# Patient Record
Sex: Female | Born: 1970 | Race: White | Hispanic: No | Marital: Married | Smoking: Never smoker
Health system: Southern US, Community
[De-identification: ages and names within clinical notes are randomized; demographics above are authoritative.]

## PROBLEM LIST (undated history)

## (undated) DIAGNOSIS — E039 Hypothyroidism, unspecified: Secondary | ICD-10-CM

## (undated) DIAGNOSIS — R112 Nausea with vomiting, unspecified: Secondary | ICD-10-CM

## (undated) DIAGNOSIS — Z87441 Personal history of nephrotic syndrome: Secondary | ICD-10-CM

## (undated) DIAGNOSIS — G2581 Restless legs syndrome: Secondary | ICD-10-CM

## (undated) DIAGNOSIS — A6920 Lyme disease, unspecified: Secondary | ICD-10-CM

## (undated) DIAGNOSIS — Z9889 Other specified postprocedural states: Secondary | ICD-10-CM

## (undated) DIAGNOSIS — N189 Chronic kidney disease, unspecified: Secondary | ICD-10-CM

## (undated) DIAGNOSIS — E349 Endocrine disorder, unspecified: Secondary | ICD-10-CM

## (undated) DIAGNOSIS — E079 Disorder of thyroid, unspecified: Secondary | ICD-10-CM

## (undated) HISTORY — PX: CARPAL TUNNEL RELEASE: SHX101

## (undated) HISTORY — PX: OTHER SURGICAL HISTORY: SHX169

---

## 2000-12-02 ENCOUNTER — Ambulatory Visit (HOSPITAL_COMMUNITY): Admission: RE | Admit: 2000-12-02 | Discharge: 2000-12-02 | Payer: Self-pay | Admitting: Gastroenterology

## 2002-10-16 ENCOUNTER — Other Ambulatory Visit: Admission: RE | Admit: 2002-10-16 | Discharge: 2002-10-16 | Payer: Self-pay | Admitting: *Deleted

## 2003-05-25 ENCOUNTER — Emergency Department (HOSPITAL_COMMUNITY): Admission: EM | Admit: 2003-05-25 | Discharge: 2003-05-25 | Payer: Self-pay | Admitting: Emergency Medicine

## 2003-05-26 ENCOUNTER — Other Ambulatory Visit (HOSPITAL_COMMUNITY): Admission: RE | Admit: 2003-05-26 | Discharge: 2003-06-02 | Payer: Self-pay | Admitting: *Deleted

## 2003-11-19 ENCOUNTER — Other Ambulatory Visit: Admission: RE | Admit: 2003-11-19 | Discharge: 2003-11-19 | Payer: Self-pay | Admitting: Gynecology

## 2004-08-22 ENCOUNTER — Inpatient Hospital Stay (HOSPITAL_COMMUNITY): Admission: AD | Admit: 2004-08-22 | Discharge: 2004-08-25 | Payer: Self-pay | Admitting: *Deleted

## 2014-05-23 ENCOUNTER — Encounter (HOSPITAL_COMMUNITY): Payer: Self-pay | Admitting: Emergency Medicine

## 2014-05-23 ENCOUNTER — Emergency Department (HOSPITAL_COMMUNITY)
Admission: EM | Admit: 2014-05-23 | Discharge: 2014-05-24 | Disposition: A | Discharge status: Home / Self Care | Payer: BC Managed Care – PPO | Attending: Emergency Medicine | Admitting: Emergency Medicine

## 2014-05-23 DIAGNOSIS — L02519 Cutaneous abscess of unspecified hand: Secondary | ICD-10-CM | POA: Insufficient documentation

## 2014-05-23 DIAGNOSIS — L03119 Cellulitis of unspecified part of limb: Principal | ICD-10-CM

## 2014-05-23 DIAGNOSIS — Z8639 Personal history of other endocrine, nutritional and metabolic disease: Secondary | ICD-10-CM | POA: Insufficient documentation

## 2014-05-23 DIAGNOSIS — Z862 Personal history of diseases of the blood and blood-forming organs and certain disorders involving the immune mechanism: Secondary | ICD-10-CM | POA: Insufficient documentation

## 2014-05-23 DIAGNOSIS — Z8669 Personal history of other diseases of the nervous system and sense organs: Secondary | ICD-10-CM | POA: Insufficient documentation

## 2014-05-23 DIAGNOSIS — Z8619 Personal history of other infectious and parasitic diseases: Secondary | ICD-10-CM | POA: Insufficient documentation

## 2014-05-23 HISTORY — DX: Restless legs syndrome: G25.81

## 2014-05-23 HISTORY — DX: Endocrine disorder, unspecified: E34.9

## 2014-05-23 HISTORY — DX: Disorder of thyroid, unspecified: E07.9

## 2014-05-23 HISTORY — DX: Lyme disease, unspecified: A69.20

## 2014-05-23 MED ORDER — CLINDAMYCIN HCL 300 MG PO CAPS: 300.0000 mg | ORAL_CAPSULE | Freq: Four times a day (QID) | ORAL | Status: DC

## 2014-05-23 MED ORDER — CLINDAMYCIN HCL 300 MG PO CAPS
300.0000 mg | ORAL_CAPSULE | Freq: Once | ORAL | Status: AC
  Administered 2014-05-23: 300 mg via ORAL
  Filled 2014-05-23: qty 1

## 2014-05-23 NOTE — Discharge Instructions (Signed)
Keep your wound infection clean and dry. Use warm compresses for 20-30 minutes to help your body fight the infection. Take the antibiotics as prescribed and followup with an orthopedic hand specialist. Return to the emergency room for any changing or worsening symptoms.    Abscess Care After An abscess (also called a boil or furuncle) is an infected area that contains a collection of pus. Signs and symptoms of an abscess include pain, tenderness, redness, or hardness, or you may feel a moveable soft area under your skin. An abscess can occur anywhere in the body. The infection may spread to surrounding tissues causing cellulitis. A cut (incision) by the surgeon was made over your abscess and the pus was drained out. Gauze may have been packed into the space to provide a drain that will allow the cavity to heal from the inside outwards. The boil may be painful for 5 to 7 days. Most people with a boil do not have high fevers. Your abscess, if seen early, may not have localized, and may not have been lanced. If not, another appointment may be required for this if it does not get better on its own or with medications. HOME CARE INSTRUCTIONS   Only take over-the-counter or prescription medicines for pain, discomfort, or fever as directed by your caregiver.  When you bathe, soak and then remove gauze or iodoform packs at least daily or as directed by your caregiver. You may then wash the wound gently with mild soapy water. Repack with gauze or do as your caregiver directs. SEEK IMMEDIATE MEDICAL CARE IF:   You develop increased pain, swelling, redness, drainage, or bleeding in the wound site.  You develop signs of generalized infection including muscle aches, chills, fever, or a general ill feeling.  An oral temperature above 102 F (38.9 C) develops, not controlled by medication. See your caregiver for a recheck if you develop any of the symptoms described above. If medications (antibiotics) were  prescribed, take them as directed. Document Released: 07/05/2005 Document Revised: 03/10/2012 Document Reviewed: 03/01/2008 [facility] Patient Information 2014 [place], [place].

## 2014-05-23 NOTE — ED Notes (Signed)
Pt A+ox4, reports onset "spot" between L 4th and 5th fingers onset x2 days ago, worsening.  Red, swollen area noted between fingers, warm.  Pt reports currently receiving treatment for lyme dx "and i think it was suppressing my immune system, my doctor told me to take a break and that's when this started".  Pt denies fevers/chills.  Skin otherwise PWD.  Pt denies other complaints.  Ambulatory with steady gait.  NAD.

## 2014-05-23 NOTE — ED Provider Notes (Signed)
CSN: [id number]     Arrival date & time 05/23/14  2156 History  This chart was scribed for non-physician practitioner, Ivonne AndrewPeter Betsy Rosello, PA-C,working with Junius ArgyleForrest S Harrison, MD, by Karle PlumberJennifer Tensley, ED Scribe.  This patient was seen in room WTR5/WTR5 and the patient's care was started at 10:22 PM.  Chief Complaint  Patient presents with  . Hand Pain   The history is provided by the patient. No language interpreter was used.   HPI Comments:  Alice Robertson is a 44 y.o. female who presents to the Emergency Department complaining of a skin infection between the fourth and fifth fingers of the left hand that started several weeks ago. She states the area began as a "tiny little bump" and has been worsening with it getting the worst in the last 24-48 hours. Pt states she has been taking herbal medications for treatment of Lyme Disease. She states her doctor told her to discontinue this treatment fearing it was suppressing her immune system. She states upon discontinuing the treatment, this area started worsening. 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 She denies any similar issue or symptoms to this area before. She denies fever, chills, nausea, or vomiting.     Past Medical History  Diagnosis Date  . Lyme disease   . Thyroid disease   . Restless leg syndrome   . Hormone imbalance    Past Surgical History  Procedure Laterality Date  . Carpal tunnel release     No family history on file. History  Substance Use Topics  . Smoking status: Never Smoker   . Smokeless tobacco: Never Used  . Alcohol Use: No   OB History   Grav Para Term Preterm Abortions TAB SAB Ect Mult Living                 Review of Systems  Constitutional: Negative for fever and chills.  Gastrointestinal: Negative for nausea and vomiting.  Skin:       Skin infection between fourth and fifth digits of left hand.    Allergies  Compazine  Home Medications   Prior to Admission  medications   Not on File   Triage Vitals: BP 126/78  Pulse 76  Temp(Src) 98 F (36.7 C) (Oral)  Resp 16  Ht 5\' 2"  (1.575 m)  Wt 115 lb (52.164 kg)  BMI 21.03 kg/m2  SpO2 100%  LMP 05/14/2014 Physical Exam  Nursing note and vitals reviewed. Constitutional: She is oriented to person, place, and time. She appears well-developed and well-nourished.  HENT:  Head: Normocephalic and atraumatic.  Eyes: EOM are normal.  Neck: Normal range of motion.  Cardiovascular: Normal rate.   Pulmonary/Chest: Effort normal.  Musculoskeletal: Normal range of motion.  Full ROM of fourth and fifth digits of left hand.  Neurological: She is alert and oriented to person, place, and time.  Skin: Skin is warm and dry.  1 cm area of erythema and swelling to the web spacing of the left fourth and fifth digit. Slight pointing and small pustule. Area is tender.  Psychiatric: She has a normal mood and affect. Her behavior is normal.    ED Course  Procedures  DIAGNOSTIC STUDIES: Oxygen Saturation is 100% on RA, normal by my interpretation.   COORDINATION OF CARE: 10:44 PM- Will use a needle to determine if area needs I & D. Will prescribe antibiotics. Pt verbalizes understanding and agrees to plan.  Area of pain was initially probed with a  small 25-gauge needle. This did express a small amount of purulent discharge. Further I&D was performed with a very thick-appearing discharge initially. There is still some firmness and nodularity that was not expressed. This may be some associated cellulitis versus a possible infected cysts or bursa. At this time will place patient on clindamycin and encourage warm compresses with orthopedic hand referral. Strict return precautions given.     INCISION AND DRAINAGE PROCEDURE NOTE: Patient identification was confirmed and verbal consent was obtained. This procedure was performed by Ivonne Andrew, PA-C at 11:00 PM. Site: web spacing between 4th and 5th digit of left  hand Sterile procedures observed Needle size: 25 G Anesthetic used (type and amt): Lidocaine 1% without Epinephrine (2 mL) Blade size: 11 G Drainage: small Complexity: Simple Site anesthetized, incision made over site, wound drained and explored loculations, rinsed with copious amounts of normal saline, wound packed with sterile gauze, covered with dry, sterile dressing.  Pt tolerated procedure well without complications.  Instructions for care discussed verbally and pt provided with additional written instructions for homecare and f/u.  Medications  clindamycin (CLEOCIN) capsule 300 mg (not administered)     MDM   Final diagnoses:  Abscess of hand      I personally performed the services described in this documentation, which was scribed in my presence. The recorded information has been reviewed and is accurate.    Angus Seller, PA-C 05/24/14 [phone]

## 2014-05-24 NOTE — ED Provider Notes (Signed)
Medical screening examination/treatment/procedure(s) were conducted as a shared visit with non-physician practitioner(s) and myself.  I personally evaluated the patient during the encounter.   EKG Interpretation None        Junius Argyle, MD 05/24/14 1210

## 2015-09-02 ENCOUNTER — Telehealth: Payer: Self-pay | Admitting: Family Medicine

## 2015-09-02 NOTE — Telephone Encounter (Signed)
Pt called to make a new patient appt and since she has the high deductable insurance plan, she is wanting a ball park figure of how much this appt. Can you please call her about this [phone]

## 2015-09-06 NOTE — Telephone Encounter (Signed)
Spoke to pt, explained to her that i was unable to answer that question due to the fact i'm not sure what all Dr. Katrinka Blazing would want to do during the visit. I provided her with billing's number to see if they can help.

## 2015-09-22 ENCOUNTER — Ambulatory Visit: Payer: Self-pay | Admitting: Family Medicine

## 2016-03-02 ENCOUNTER — Ambulatory Visit: Payer: Self-pay | Admitting: Family Medicine

## 2016-03-22 ENCOUNTER — Ambulatory Visit: Payer: Self-pay | Admitting: Family Medicine

## 2016-05-17 ENCOUNTER — Other Ambulatory Visit: Payer: Self-pay | Admitting: Obstetrics & Gynecology

## 2016-05-17 ENCOUNTER — Other Ambulatory Visit (HOSPITAL_COMMUNITY)
Admission: RE | Admit: 2016-05-17 | Discharge: 2016-05-17 | Disposition: A | Discharge status: Home / Self Care | Payer: 59 | Source: Ambulatory Visit | Attending: Obstetrics & Gynecology | Admitting: Obstetrics & Gynecology

## 2016-05-17 DIAGNOSIS — Z01419 Encounter for gynecological examination (general) (routine) without abnormal findings: Secondary | ICD-10-CM | POA: Diagnosis present

## 2016-05-17 DIAGNOSIS — Z1151 Encounter for screening for human papillomavirus (HPV): Secondary | ICD-10-CM | POA: Diagnosis not present

## 2016-05-21 LAB — CYTOLOGY - PAP

## 2016-12-09 ENCOUNTER — Encounter (HOSPITAL_BASED_OUTPATIENT_CLINIC_OR_DEPARTMENT_OTHER): Payer: Self-pay | Admitting: Emergency Medicine

## 2016-12-09 ENCOUNTER — Emergency Department (HOSPITAL_BASED_OUTPATIENT_CLINIC_OR_DEPARTMENT_OTHER): Payer: 59

## 2016-12-09 ENCOUNTER — Emergency Department (HOSPITAL_BASED_OUTPATIENT_CLINIC_OR_DEPARTMENT_OTHER)
Admission: EM | Admit: 2016-12-09 | Discharge: 2016-12-09 | Disposition: A | Discharge status: Home / Self Care | Payer: 59 | Attending: Emergency Medicine | Admitting: Emergency Medicine

## 2016-12-09 DIAGNOSIS — R531 Weakness: Secondary | ICD-10-CM | POA: Diagnosis not present

## 2016-12-09 DIAGNOSIS — J029 Acute pharyngitis, unspecified: Secondary | ICD-10-CM | POA: Diagnosis not present

## 2016-12-09 DIAGNOSIS — R600 Localized edema: Secondary | ICD-10-CM | POA: Diagnosis present

## 2016-12-09 DIAGNOSIS — R197 Diarrhea, unspecified: Secondary | ICD-10-CM | POA: Insufficient documentation

## 2016-12-09 DIAGNOSIS — N049 Nephrotic syndrome with unspecified morphologic changes: Secondary | ICD-10-CM

## 2016-12-09 DIAGNOSIS — R809 Proteinuria, unspecified: Secondary | ICD-10-CM

## 2016-12-09 LAB — URINALYSIS, ROUTINE W REFLEX MICROSCOPIC
Bilirubin Urine: NEGATIVE
Glucose, UA: NEGATIVE mg/dL
Ketones, ur: NEGATIVE mg/dL
Leukocytes, UA: NEGATIVE
Nitrite: NEGATIVE
Protein, ur: >300 mg/dL — AB
Specific Gravity, Urine: 1.014 (ref 1.005–1.030)
pH: 6 (ref 5.0–8.0)

## 2016-12-09 LAB — COMPREHENSIVE METABOLIC PANEL
ALT: 13 U/L — ABNORMAL LOW (ref 14–54)
AST: 27 U/L (ref 15–41)
Albumin: 1.2 g/dL — ABNORMAL LOW (ref 3.5–5.0)
Alkaline Phosphatase: 43 U/L (ref 38–126)
Anion gap: 7 (ref 5–15)
BUN: 12 mg/dL (ref 6–20)
CO2: 23 mmol/L (ref 22–32)
Calcium: 7.4 mg/dL — ABNORMAL LOW (ref 8.9–10.3)
Chloride: 98 mmol/L — ABNORMAL LOW (ref 101–111)
Creatinine, Ser: 0.83 mg/dL (ref 0.44–1.00)
GFR calc Af Amer: >60 mL/min (ref >60)
GFR calc non Af Amer: >60 mL/min (ref >60)
Glucose, Bld: 89 mg/dL (ref 65–99)
Potassium: 4 mmol/L (ref 3.5–5.1)
Sodium: 128 mmol/L — ABNORMAL LOW (ref 135–145)
Total Bilirubin: 0.3 mg/dL (ref 0.3–1.2)
Total Protein: 4.3 g/dL — ABNORMAL LOW (ref 6.5–8.1)

## 2016-12-09 LAB — CBC WITH DIFFERENTIAL/PLATELET
Basophils Absolute: 0 10*3/uL (ref 0.0–0.1)
Basophils Relative: 1 %
Eosinophils Absolute: 0.2 10*3/uL (ref 0.0–0.7)
Eosinophils Relative: 2 %
HCT: 42.2 % (ref 36.0–46.0)
Hemoglobin: 14.7 g/dL (ref 12.0–15.0)
Lymphocytes Relative: 29 %
Lymphs Abs: 2.4 10*3/uL (ref 0.7–4.0)
MCH: 28.8 pg (ref 26.0–34.0)
MCHC: 34.8 g/dL (ref 30.0–36.0)
MCV: 82.6 fL (ref 78.0–100.0)
Monocytes Absolute: 0.9 10*3/uL (ref 0.1–1.0)
Monocytes Relative: 11 %
Neutro Abs: 4.8 10*3/uL (ref 1.7–7.7)
Neutrophils Relative %: 58 %
Platelets: 523 10*3/uL — ABNORMAL HIGH (ref 150–400)
RBC: 5.11 MIL/uL (ref 3.87–5.11)
RDW: 13 % (ref 11.5–15.5)
WBC: 8.3 10*3/uL (ref 4.0–10.5)

## 2016-12-09 LAB — URINALYSIS, MICROSCOPIC (REFLEX)

## 2016-12-09 LAB — TROPONIN I: Troponin I: <0.03 ng/mL (ref <0.03)

## 2016-12-09 LAB — BRAIN NATRIURETIC PEPTIDE: B Natriuretic Peptide: 18.4 pg/mL (ref 0.0–100.0)

## 2016-12-09 MED ORDER — POTASSIUM CHLORIDE CRYS ER 20 MEQ PO TBCR: 20.0000 meq | EXTENDED_RELEASE_TABLET | Freq: Every day | ORAL | 0 refills | Status: DC

## 2016-12-09 MED ORDER — FUROSEMIDE 40 MG PO TABS: 80.0000 mg | ORAL_TABLET | Freq: Every day | ORAL | 1 refills | Status: DC

## 2016-12-09 NOTE — ED Triage Notes (Addendum)
Pt reports diarrhea, sore throat generally not feeling well x 1 week. Pt has not been seen for this. Pt reports she has been having edema in her abd and back. Pt states she has had edema to her legs and is following up with a cardiologist for this in January. Pt reports 10+ lbs weight gain in 1 week.

## 2016-12-09 NOTE — ED Notes (Signed)
Pt given d/c instructions as per chart. Rx x 2. Verbalizes understanding. No questions. 

## 2016-12-09 NOTE — Discharge Instructions (Signed)
Take the Lasix as prescribed. Dr. Briant CedarMattingly , nephrology, suggested you may need 80 mg twice a day. You can start with a 40 mg tablet tomorrow as long as you  tolerate that increased to 80 mg per day. The potassium prescription is to help with potassium losses associated with the Lasix.

## 2016-12-09 NOTE — ED Notes (Addendum)
States has been having lower  leg edema for the past 2 months and now has swelling to abd. Currently receiving "ozone tx" for Lyme's Disease. Sore throat today but denies at present, no appetite

## 2016-12-09 NOTE — ED Provider Notes (Signed)
[facility] Provider Note   CSN: [id number] Arrival date & time: 12/09/16  1709 By signing my name below, I, Alice Robertson, attest that this documentation has been prepared under the direction and in the presence of Alice DibblesJon Abanoub Hanken, MD. Electronically Signed: Bridgette HabermannMaria Robertson, ED Scribe. 12/09/16. 7:24 PM.  History   Chief Complaint Chief Complaint  Patient presents with  . multiple complaints   HPI Comments: Alice Robertson is a 46 y.o. female with h/o lyme disease and thyroid disease who presents to the Emergency Department for multiple complaints. Pt is complaining of bilateral leg swelling onset a couple weeks ago. Pt saw her PCP who tried to treat her leg swelling with no relief. She was referred to do a cardiologist who she does not see until 01/16/17. Pt is also complaining of generalized weakness onset one week ago with associated diarrhea, congestion, decreased appetite, and sore throat.  Pt also states she has swelling in her back and abdomen that has caused her to gain 10+ pounds in one week. She thinks this is from her recent congestion. She has not tried any OTC medications PTA. Pt denies fever, chills, or any other associated symptoms.   The history is provided by the patient. No language interpreter was used.    Past Medical History:  Diagnosis Date  . Hormone imbalance   . Lyme disease   . Restless leg syndrome   . Thyroid disease     There are no active problems to display for this patient.   Past Surgical History:  Procedure Laterality Date  . CARPAL TUNNEL RELEASE      OB History    No data available       Home Medications    Prior to Admission medications   Medication Sig Start Date End Date Taking? Authorizing Provider  nystatin cream (MYCOSTATIN) Apply 1 application topically 2 (two) times daily.   Yes Historical Provider, MD  progesterone (PROMETRIUM) 100 MG capsule Take 100 mg by mouth daily.   Yes Historical Provider, MD  rOPINIRole (REQUIP)  0.5 MG tablet Take 0.5 mg by mouth 3 (three) times daily.   Yes Historical Provider, MD  sertraline (ZOLOFT) 50 MG tablet Take 50 mg by mouth daily.   Yes Historical Provider, MD  Specialty Vitamins Products (MAGNESIUM, AMINO ACID CHELATE,) 133 MG tablet Take 1 tablet by mouth 2 (two) times daily.   Yes Historical Provider, MD  clindamycin (CLEOCIN) 300 MG capsule Take 1 capsule (300 mg total) by mouth 4 (four) times daily. X 7 days 05/23/14   Alice AndrewPeter Dammen, PA-C  furosemide (LASIX) 40 MG tablet Take 2 tablets (80 mg total) by mouth daily. 12/09/16   Alice DibblesJon Donnald Tabar, MD  potassium chloride SA (K-DUR,KLOR-CON) 20 MEQ tablet Take 1 tablet (20 mEq total) by mouth daily. 12/09/16   Alice DibblesJon Babak Lucus, MD    Family History No family history on file.  Social History Social History  Substance Use Topics  . Smoking status: Never Smoker  . Smokeless tobacco: Never Used  . Alcohol use No     Allergies   Compazine [prochlorperazine edisylate]   Review of Systems Review of Systems  Constitutional: Positive for appetite change and unexpected weight change. Negative for chills and fever.  HENT: Positive for congestion and sore throat.   Cardiovascular: Positive for leg swelling.  Gastrointestinal: Positive for abdominal distention and diarrhea.  Musculoskeletal: Positive for back pain.  Neurological: Positive for weakness.  All other systems reviewed and are negative.  Physical Exam Updated Vital Signs BP 116/80 (BP Location: Left Arm)   Pulse 84   Temp 97.9 F (36.6 C) (Oral)   Resp 18   Wt 61.7 kg   LMP 12/08/2016   SpO2 99%   BMI 24.87 kg/m   Physical Exam  Constitutional: She appears well-developed and well-nourished. No distress.  HENT:  Head: Normocephalic and atraumatic.  Right Ear: External ear normal.  Left Ear: External ear normal.  Mouth/Throat: Mucous membranes are dry. No uvula swelling. Posterior oropharyngeal erythema present. No oropharyngeal exudate.  Eyes: Conjunctivae  are normal. Right eye exhibits no discharge. Left eye exhibits no discharge. No scleral icterus.  Neck: Neck supple. No tracheal deviation present.  Cardiovascular: Normal rate, regular rhythm and intact distal pulses.   Pulmonary/Chest: Effort normal and breath sounds normal. No stridor. No respiratory distress. She has no wheezes. She has no rales.  Abdominal: Soft. Bowel sounds are normal. She exhibits no distension. There is no tenderness. There is no rebound and no guarding.  Mild dependent edema in the flank.  Musculoskeletal: She exhibits edema (mild pitting bilateral ankles). She exhibits no tenderness.  Neurological: She is alert. She has normal strength. No cranial nerve deficit (no facial droop, extraocular movements intact, no slurred speech) or sensory deficit. She exhibits normal muscle tone. She displays no seizure activity. Coordination normal.  Skin: Skin is warm and dry. No rash noted.  Psychiatric: She has a normal mood and affect.  Nursing note and vitals reviewed.    ED Treatments / Results  DIAGNOSTIC STUDIES: Oxygen Saturation is 98% on RA, normal by my interpretation.    COORDINATION OF CARE: 7:21 PM Discussed treatment plan with pt at bedside which includes lab work and pt agreed to plan.  Labs (all labs ordered are listed, but only abnormal results are displayed) Labs Reviewed  COMPREHENSIVE METABOLIC PANEL - Abnormal; Notable for the following:       Result Value   Sodium 128 (*)    Chloride 98 (*)    Calcium 7.4 (*)    Total Protein 4.3 (*)    Albumin 1.2 (*)    ALT 13 (*)    All other components within normal limits  CBC WITH DIFFERENTIAL/PLATELET - Abnormal; Notable for the following:    Platelets 523 (*)    All other components within normal limits  URINALYSIS, ROUTINE W REFLEX MICROSCOPIC - Abnormal; Notable for the following:    APPearance CLOUDY (*)    Hgb urine dipstick LARGE (*)    Protein, ur >300 (*)    All other components within normal  limits  URINALYSIS, MICROSCOPIC (REFLEX) - Abnormal; Notable for the following:    Bacteria, UA MANY (*)    Squamous Epithelial / LPF 0-5 (*)    All other components within normal limits  BRAIN NATRIURETIC PEPTIDE  TROPONIN I  ANTINUCLEAR ANTIBODIES, IFA  ANCA TITERS  MPO/PR-3 (ANCA) ANTIBODIES  C3 COMPLEMENT  C4 COMPLEMENT  TSH  T4, FREE    EKG  EKG Interpretation  Date/Time:  Sunday December 09 2016 19:02:53 EST Ventricular Rate:  71 PR Interval:    QRS Duration: 99 QT Interval:  398 QTC Calculation: 433 R Axis:   68 Text Interpretation:  Sinus rhythm Borderline short PR interval No old tracing to compare Confirmed by Megen Madewell  MD-J, Cru Kritikos [phone]) on 12/09/2016 7:09:39 PM       Radiology Dg Chest 2 View  Result Date: 12/09/2016 CLINICAL DATA:  Diarrhea and sore throat.  Weight gain.  EXAM: CHEST  2 VIEW COMPARISON:  None. FINDINGS: Small bilateral pleural effusions. Cardiac and mediastinal margins appear normal. The lungs appear otherwise clear. No significant bony abnormality. IMPRESSION: 1. Small bilateral pleural effusions, slightly larger on the left than the right. Otherwise, no significant abnormalities are observed. Electronically Signed   By: Gaylyn RongWalter  Liebkemann M.D.   On: 12/09/2016 20:07    Procedures Procedures (including critical care time)    Initial Impression / Assessment and Plan / ED Course  I have reviewed the triage vital signs and the nursing notes.  Pertinent labs & imaging results that were available during my care of the patient were reviewed by me and considered in my medical decision making (see chart for details).  Clinical Course as of Dec 09 2126  Wynelle LinkSun Dec 09, 2016  2125 I discussed the case with Dr. Briant CedarMattingly. He recommends adding on ANA, C3, C4, and ANCA tests.  He will have his office called to set up a follow-up appointment. He recommends starting on 80 mg per day of Lasix and she may indeed need a 80 mg twice daily.  [JK]    Clinical  Course User Index [JK] Alice DibblesJon Necole Minassian, MD   Patient's evaluation is suggestive of a possible nephrotic syndrome. The patient has hypoproteinemia associated with hyper proteinuria.  I doubt cardiac etiology. The patient has only been taken hydrochlorothiazide for her edema. I will start her on 80 mg of Lasix daily. She may need to increase from that. Dr. Briant CedarMattingly office will call to schedule a follow-up evaluation.   Final Clinical Impressions(s) / ED Diagnoses   Final diagnoses:  Proteinuria, unspecified type  Nephrotic syndrome    New Prescriptions New Prescriptions   FUROSEMIDE (LASIX) 40 MG TABLET    Take 2 tablets (80 mg total) by mouth daily.   POTASSIUM CHLORIDE SA (K-DUR,KLOR-CON) 20 MEQ TABLET    Take 1 tablet (20 mEq total) by mouth daily.   I personally performed the services described in this documentation, which was scribed in my presence.  The recorded information has been reviewed and is accurate.    Alice DibblesJon Ailine Hefferan, MD 12/09/16 2127

## 2016-12-10 LAB — TSH: TSH: 4.096 u[IU]/mL (ref 0.350–4.500)

## 2016-12-10 LAB — T4, FREE: Free T4: 0.68 ng/dL (ref 0.61–1.12)

## 2016-12-13 ENCOUNTER — Other Ambulatory Visit (HOSPITAL_COMMUNITY): Payer: Self-pay | Admitting: Nephrology

## 2016-12-13 DIAGNOSIS — N049 Nephrotic syndrome with unspecified morphologic changes: Secondary | ICD-10-CM

## 2016-12-14 LAB — C3 COMPLEMENT: C3 Complement: 130

## 2016-12-14 LAB — ANCA TITERS
Atypical P-ANCA titer: <1:20 {titer}
C-ANCA: <1:20 {titer}
P-ANCA: <1:20 {titer}

## 2016-12-14 LAB — C4 COMPLEMENT: Complement C4, Body Fluid: 23

## 2016-12-14 LAB — MPO/PR-3 (ANCA) ANTIBODIES

## 2016-12-14 LAB — ANTINUCLEAR ANTIBODIES, IFA: ANA Ab, IFA: NEGATIVE

## 2016-12-26 ENCOUNTER — Other Ambulatory Visit: Payer: Self-pay | Admitting: Radiology

## 2016-12-27 ENCOUNTER — Ambulatory Visit (HOSPITAL_COMMUNITY)
Admission: RE | Admit: 2016-12-27 | Discharge: 2016-12-27 | Disposition: A | Discharge status: Home / Self Care | Payer: 59 | Source: Ambulatory Visit | Attending: Nephrology | Admitting: Nephrology

## 2016-12-27 ENCOUNTER — Encounter (HOSPITAL_COMMUNITY): Payer: Self-pay

## 2016-12-27 DIAGNOSIS — N049 Nephrotic syndrome with unspecified morphologic changes: Secondary | ICD-10-CM | POA: Diagnosis not present

## 2016-12-27 DIAGNOSIS — R809 Proteinuria, unspecified: Secondary | ICD-10-CM | POA: Diagnosis not present

## 2016-12-27 DIAGNOSIS — M7989 Other specified soft tissue disorders: Secondary | ICD-10-CM | POA: Insufficient documentation

## 2016-12-27 HISTORY — DX: Hypothyroidism, unspecified: E03.9

## 2016-12-27 HISTORY — DX: Chronic kidney disease, unspecified: N18.9

## 2016-12-27 LAB — CBC
HCT: 41.1 % (ref 36.0–46.0)
Hemoglobin: 13.6 g/dL (ref 12.0–15.0)
MCH: 28.3 pg (ref 26.0–34.0)
MCHC: 33.1 g/dL (ref 30.0–36.0)
MCV: 85.6 fL (ref 78.0–100.0)
Platelets: 451 10*3/uL — ABNORMAL HIGH (ref 150–400)
RBC: 4.8 MIL/uL (ref 3.87–5.11)
RDW: 13.4 % (ref 11.5–15.5)
WBC: 5.3 10*3/uL (ref 4.0–10.5)

## 2016-12-27 LAB — APTT: aPTT: 28 seconds (ref 24–36)

## 2016-12-27 LAB — PROTIME-INR
INR: 0.92
Prothrombin Time: 12.3 seconds (ref 11.4–15.2)

## 2016-12-27 LAB — PREGNANCY, URINE: Preg Test, Ur: NEGATIVE

## 2016-12-27 MED ORDER — GELATIN ABSORBABLE 12-7 MM EX MISC
CUTANEOUS | Status: AC
  Filled 2016-12-27: qty 1

## 2016-12-27 MED ORDER — FENTANYL CITRATE (PF) 100 MCG/2ML IJ SOLN
INTRAMUSCULAR | Status: AC
  Filled 2016-12-27: qty 2

## 2016-12-27 MED ORDER — SODIUM CHLORIDE 0.9 % IV SOLN: INTRAVENOUS | Status: DC

## 2016-12-27 MED ORDER — MIDAZOLAM HCL 2 MG/2ML IJ SOLN
INTRAMUSCULAR | Status: AC
  Filled 2016-12-27: qty 2

## 2016-12-27 MED ORDER — MIDAZOLAM HCL 2 MG/2ML IJ SOLN
INTRAMUSCULAR | Status: AC | PRN
  Administered 2016-12-27: 1 mg via INTRAVENOUS

## 2016-12-27 MED ORDER — FENTANYL CITRATE (PF) 100 MCG/2ML IJ SOLN
INTRAMUSCULAR | Status: AC | PRN
  Administered 2016-12-27: 50 ug via INTRAVENOUS

## 2016-12-27 MED ORDER — LIDOCAINE HCL (PF) 1 % IJ SOLN
INTRAMUSCULAR | Status: AC
  Filled 2016-12-27: qty 10

## 2016-12-27 MED ORDER — SODIUM CHLORIDE 0.9 % IV SOLN
INTRAVENOUS | Status: AC | PRN
  Administered 2016-12-27: 10 mL/h via INTRAVENOUS

## 2016-12-27 NOTE — H&P (Signed)
Chief Complaint: Patient was seen in consultation today for nephrosis  Referring Physician(s):  Dr. Bufford ButtnerElizabeth Upton  Supervising Physician: Gilmer MorWagner, Jaime  [facility]  History of Present Illness: Alice Robertson Faye is a 46 y.o. female with history of hypothyroid and lyme disease who presented to [facility] with leg swelling.  Patient was found to have proteinuria suggestive of nephrosis.   IR was consulted by Dr. Bufford ButtnerElizabeth Upton for random renal biopsy for further evaluation of patient's kidney disease.  Patient has been NPO.  She is not on any blood thinners.    Past Medical History:  Diagnosis Date  . Chronic kidney disease   . Hormone imbalance   . Hypothyroid   . Lyme disease   . Lyme disease   . Restless leg syndrome   . Thyroid disease     Past Surgical History:  Procedure Laterality Date  . CARPAL TUNNEL RELEASE    . carpel tunnel      Allergies: Compazine [prochlorperazine edisylate]  Medications: Prior to Admission medications   Medication Sig Start Date End Date Taking? Authorizing Provider  LIOTRIX, T3-T4, PO Take 1 capsule by mouth every morning. 50-35 mg   Yes Historical Provider, MD  lisinopril (PRINIVIL,ZESTRIL) 2.5 MG tablet Take 2.5 mg by mouth daily.   Yes Historical Provider, MD  MAGNESIUM MALATE PO Take 1,350 mg by mouth at bedtime. Each tablet is 450 mg - pt takes 3 tablets at bedtime   Yes Historical Provider, MD  Multiple Vitamins-Minerals (MULTIVITAMIN WITH MINERALS) tablet Take 1 tablet by mouth daily.   Yes Historical Provider, MD  NATURE-THROID 32.5 MG tablet Take 1 tablet by mouth 2 (two) times daily. 09/19/16  Yes Historical Provider, MD  NONFORMULARY OR COMPOUNDED ITEM Apply 1 application topically every morning. Neuro Boilogic Cream with B vitamins   Yes Historical Provider, MD  potassium chloride SA (K-DUR,KLOR-CON) 20 MEQ tablet Take 1 tablet (20 mEq total) by mouth daily. 12/09/16  Yes Linwood DibblesJon Knapp, MD  Probiotic Product  (PROBIOTIC DAILY PO) Take 1 capsule by mouth 2 (two) times daily.   Yes Historical Provider, MD  pyridoxine (B-6) 100 MG tablet Take 100 mg by mouth daily.   Yes Historical Provider, MD  Pyridoxine HCl (B-6 PO) Take 5 sprays by mouth daily.   Yes Historical Provider, MD  rOPINIRole (REQUIP) 0.5 MG tablet Take 1 mg by mouth at bedtime.    Yes Historical Provider, MD  sertraline (ZOLOFT) 25 MG tablet Take 25 mg by mouth daily.   Yes Historical Provider, MD  torsemide (DEMADEX) 20 MG tablet Take 80 mg by mouth 2 (two) times daily.   Yes Historical Provider, MD  Turmeric Curcumin 500 MG CAPS Take 1 capsule by mouth 2 (two) times daily.   Yes Historical Provider, MD  zinc gluconate 50 MG tablet Take 50 mg by mouth daily.   Yes Historical Provider, MD     Family History  Problem Relation Age of Onset  . Brain cancer Father     Social History   Social History  . Marital status: Married    Spouse name: N/A  . Number of children: N/A  . Years of education: N/A   Social History Main Topics  . Smoking status: Never Smoker  . Smokeless tobacco: Never Used  . Alcohol use No  . Drug use: Unknown  . Sexual activity: Not Asked   Other Topics Concern  . None   Social History Narrative  . None  Review of Systems  Constitutional: Positive for fatigue.  Respiratory: Negative for cough and shortness of breath.   Cardiovascular: Negative for chest pain.  Musculoskeletal:       Leg swelling    Vital Signs: BP 106/75   Pulse 75   Temp 98.3 F (36.8 C) (Oral)   Resp 16   Ht 5\' 2"  (1.575 m)   Wt 112 lb (50.8 kg)   LMP 12/08/2016   SpO2 100%   BMI 20.49 kg/m   Physical Exam  Constitutional: She is oriented to person, place, and time. She appears well-developed.  Cardiovascular: Normal rate, regular rhythm and normal heart sounds.   Pulmonary/Chest: Effort normal and breath sounds normal. No respiratory distress.  Neurological: She is alert and oriented to person, place, and time.   Skin: Skin is warm and dry.  Psychiatric: She has a normal mood and affect. Her behavior is normal. Judgment and thought content normal.  Nursing note and vitals reviewed.   Mallampati Score:  MD Evaluation Airway: WNL Heart: WNL Abdomen: WNL Chest/ Lungs: WNL ASA  Classification: 2 Mallampati/Airway Score: One  Imaging: Dg Chest 2 View  Result Date: 12/09/2016 CLINICAL DATA:  Diarrhea and sore throat.  Weight gain. EXAM: CHEST  2 VIEW COMPARISON:  None. FINDINGS: Small bilateral pleural effusions. Cardiac and mediastinal margins appear normal. The lungs appear otherwise clear. No significant bony abnormality. IMPRESSION: 1. Small bilateral pleural effusions, slightly larger on the left than the right. Otherwise, no significant abnormalities are observed. Electronically Signed   By: Gaylyn RongWalter  Liebkemann M.D.   On: 12/09/2016 20:07    Labs:  CBC:  Recent Labs  12/09/16 1926 12/27/16 0620  WBC 8.3 5.3  HGB 14.7 13.6  HCT 42.2 41.1  PLT 523* 451*    COAGS: No results for input(s): INR, APTT in the last 8760 hours.  BMP:  Recent Labs  12/09/16 1926  NA 128*  K 4.0  CL 98*  CO2 23  GLUCOSE 89  BUN 12  CALCIUM 7.4*  CREATININE 0.83  GFRNONAA >60  GFRAA >60    LIVER FUNCTION TESTS:  Recent Labs  12/09/16 1926  BILITOT 0.3  AST 27  ALT 13*  ALKPHOS 43  PROT 4.3*  ALBUMIN 1.2*    TUMOR MARKERS: No results for input(s): AFPTM, CEA, CA199, CHROMGRNA in the last 8760 hours.  Assessment and Plan: Patient with history of leg swelling and proteinuria presents for random renal biopsy.  Patient is scheduled for procedure today.  She has been NPO.  She is not on blood thinners.  Risks and Benefits discussed with the patient including, but not limited to bleeding, infection, damage to adjacent structures or low yield requiring additional tests. All of the patient's questions were answered, patient is agreeable to proceed. Consent signed and in  chart.  Thank you for this interesting consult.  I greatly enjoyed meeting Alice Robertson Diemer and look forward to participating in their care.  A copy of this report was sent to the requesting provider on this date.  Electronically Signed: Hoyt KochKacie Sue-Ellen Matthews 12/27/2016, 7:37 AM   I spent a total of  30 Minutes  in face to face in clinical consultation, greater than 50% of which was counseling/coordinating care for nephrosis.

## 2016-12-27 NOTE — Discharge Instructions (Signed)
Percutaneous Kidney Biopsy, Care After °This sheet gives you information about how to care for yourself after your procedure. Your health care provider may also give you more specific instructions. If you have problems or questions, contact your health care provider. °What can I expect after the procedure? °After the procedure, it is common to have: °· Pain or soreness near the area where the needle went through your skin (biopsy site). °· Bright pink or cloudy urine for 24 hours after the procedure. °Follow these instructions at home: °Activity  °· Return to your normal activities as told by your health care provider. Ask your health care provider what activities are safe for you. °· Do not drive for 24 hours if you were given a medicine to help you relax (sedative). °· Do not lift anything that is heavier than 10 lb (4.5 kg) until your health care provider tells you that it is safe. °· Avoid activities that take a lot of effort (are strenuous) until your health care provider approves. Most people will have to wait 2 weeks before returning to activities such as exercise or sexual intercourse. °General instructions  °· Take over-the-counter and prescription medicines only as told by your health care provider. °· You may eat and drink after your procedure. Follow instructions from your health care provider about eating or drinking restrictions. °· Check your biopsy site every day for signs of infection. Check for: °¨ More redness, swelling, or pain. °¨ More fluid or blood. °¨ Warmth. °¨ Pus or a bad smell. °· Keep all follow-up visits as told by your health care provider. This is important. °Contact a health care provider if: °· You have more redness, swelling, or pain around your biopsy site. °· You have more fluid or blood coming from your biopsy site. °· Your biopsy site feels warm to the touch. °· You have pus or a bad smell coming from your biopsy site. °· You have blood in your urine more than 24 hours after  your procedure. °Get help right away if: °· You have dark red or brown urine. °· You have a fever. °· You are unable to urinate. °· You feel burning when you urinate. °· You feel faint. °· You have severe pain in your abdomen or side. °This information is not intended to replace advice given to you by your health care provider. Make sure you discuss any questions you have with your health care provider. °Document Released: 08/19/2013 Document Revised: 09/28/2016 Document Reviewed: 09/28/2016 °Elsevier Interactive Patient Education © 2017 [employer] ° °

## 2016-12-27 NOTE — Procedures (Signed)
Interventional Radiology Procedure Note  Procedure: US guided left renal biopsy, medical renal.   Complications: None Recommendations:  - Ok to shower tomorrow - Do not submerge for 7 days - Routine care  - 2 hour obs - advance diet  Signed,  Yvone NeuJaime S. Loreta AveWagner, DO

## 2016-12-27 NOTE — Sedation Documentation (Signed)
Called to give report. Nurse unavailable. Will call back 

## 2017-01-03 ENCOUNTER — Encounter (HOSPITAL_COMMUNITY): Payer: Self-pay

## 2017-01-16 ENCOUNTER — Ambulatory Visit: Payer: 59 | Admitting: Cardiovascular Disease

## 2017-01-28 ENCOUNTER — Encounter (HOSPITAL_COMMUNITY): Payer: Self-pay

## 2017-03-14 ENCOUNTER — Other Ambulatory Visit: Payer: Self-pay | Admitting: Nephrology

## 2017-03-14 DIAGNOSIS — Z1231 Encounter for screening mammogram for malignant neoplasm of breast: Secondary | ICD-10-CM

## 2017-04-12 ENCOUNTER — Ambulatory Visit
Admission: RE | Admit: 2017-04-12 | Discharge: 2017-04-12 | Disposition: A | Discharge status: Home / Self Care | Payer: 59 | Source: Ambulatory Visit | Attending: Nephrology | Admitting: Nephrology

## 2017-04-12 DIAGNOSIS — Z1231 Encounter for screening mammogram for malignant neoplasm of breast: Secondary | ICD-10-CM

## 2017-04-16 ENCOUNTER — Other Ambulatory Visit: Payer: Self-pay | Admitting: Nephrology

## 2017-04-16 DIAGNOSIS — R928 Other abnormal and inconclusive findings on diagnostic imaging of breast: Secondary | ICD-10-CM

## 2017-04-19 ENCOUNTER — Ambulatory Visit
Admission: RE | Admit: 2017-04-19 | Discharge: 2017-04-19 | Disposition: A | Discharge status: Home / Self Care | Payer: 59 | Source: Ambulatory Visit | Attending: Nephrology | Admitting: Nephrology

## 2017-04-19 DIAGNOSIS — R928 Other abnormal and inconclusive findings on diagnostic imaging of breast: Secondary | ICD-10-CM

## 2017-07-17 ENCOUNTER — Encounter: Payer: Self-pay | Admitting: Neurology

## 2017-07-18 ENCOUNTER — Ambulatory Visit (INDEPENDENT_AMBULATORY_CARE_PROVIDER_SITE_OTHER): Payer: 59 | Admitting: Neurology

## 2017-07-18 ENCOUNTER — Encounter: Payer: Self-pay | Admitting: Neurology

## 2017-07-18 VITALS — BP 93/62 | HR 78 | Ht 62.0 in | Wt 120.0 lb

## 2017-07-18 DIAGNOSIS — G8929 Other chronic pain: Secondary | ICD-10-CM | POA: Insufficient documentation

## 2017-07-18 DIAGNOSIS — G2581 Restless legs syndrome: Secondary | ICD-10-CM

## 2017-07-18 DIAGNOSIS — G4769 Other sleep related movement disorders: Secondary | ICD-10-CM

## 2017-07-18 DIAGNOSIS — R292 Abnormal reflex: Secondary | ICD-10-CM | POA: Diagnosis not present

## 2017-07-18 DIAGNOSIS — M549 Dorsalgia, unspecified: Secondary | ICD-10-CM | POA: Diagnosis not present

## 2017-07-18 MED ORDER — PRAMIPEXOLE DIHYDROCHLORIDE 0.25 MG PO TABS: ORAL_TABLET | ORAL | 1 refills | Status: DC

## 2017-07-18 NOTE — Patient Instructions (Signed)

## 2017-07-18 NOTE — Progress Notes (Signed)
SLEEP MEDICINE CLINIC   Provider:  Melvyn Novas, M D  Primary Care Physician:  Dr. Bufford Buttner, MD  Referring Provider:   Chief Complaint  Patient presents with  . New Patient (Initial Visit)    HPI:  Alice Robertson is a 47 y.o. female , seen here as in a referral from Dr. Signe Colt, her nephrologist. 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 Her first symptoms were body edema, anasarka due to  fluid retention and she exhibited nephrotic range proteinuria.  A serologic workup was negative and a renal biopsy revealed minimal change disease. She also has been diagnosed with " chronic " Lyme disease which has been treated by Dr. Lelon Perla. The patient has hypothyroidism, and she was diagnosed with restless legs- which related to this referral.  Alice Robertson was diagnosed with restless leg syndrome approximately 15 or 20 years ago and she had been followed by Dr.Vaughn Gwynn Burly at [facility]. He initiated treatment with Requip which has been continued by her primary care physician. The patient had to increase her dose upwards over the last years and she also reports that Requip causes some nausea. This has led to vomiting in the evening hours. It is especially bothersome while she travels. Her RLS begins bothering in late afternoon, sometimes when not moving she can have them in daytime, while on a plane, or while a passenger in the car. She may wake in the middle of the night.  The patient has not tried another prescription for aside from Requip. She has been on prednisone for her renal condition .She did try a nutritional supplement called "Mucana Dopa ".She continues with immediate release requip in a generic form.    Sleep habits are as follows: The patient is usually going to bed between 9 and 10 PM, but she struggles with restless legs which delays sleep onset. She may be in bed for for 30 minutes or 90 minutes before  she finally. She frequently wakes up around 3 AM and feels that restless legs are contributing on many occasions. It is difficult for her to go back to sleep, and if she is able to sleep she feels that the sleep quality restored refresh her. She reports that she dreams but these are not nightmarish.  She rises at 6 AM on work days, on other days at 9.30. She feels groggy and tired.  She does not longer nap in daytime, because her RLS keeps her up.    Sleep medical history and family sleep history: paternal uncle with RLS, mother is a loud snorer.  Night terrors as a child and sleep walking- major depression , chronic Lyme.   Social history: married, Sales executive , one child, non tobacco user, no ETOH use, Caffeine - none.   Review of Systems: Out of a complete 14 system review, the patient complains of only the following symptoms, and all other reviewed systems are negative.  RLS, depression. The patient qualified that she is sleepy in daytime and would laugh to go to sleep but her restless legs prevent her from doing so- therefore she endorsed the Epworth sleepiness score at 0 points. Carpaltunnel,  Upper back pain, spasms. anasarka   Epworth score 0 , Fatigue severity score 45  , depression score    Social History   Social History  . Marital status: Married    Spouse name: N/A  . Number of children: N/A  . Years  of education: N/A   Occupational History  . Not on file.   Social History Main Topics  . Smoking status: Never Smoker  . Smokeless tobacco: Never Used  . Alcohol use No  . Drug use: Unknown  . Sexual activity: Not on file   Other Topics Concern  . Not on file   Social History Narrative  . No narrative on file    Family History  Problem Relation Age of Onset  . Brain cancer Father     Past Medical History:  Diagnosis Date  . Chronic kidney disease   . Hormone imbalance   . Hypothyroid   . Lyme disease   . Lyme disease   . Restless leg syndrome   .  Thyroid disease     Past Surgical History:  Procedure Laterality Date  . CARPAL TUNNEL RELEASE    . carpel tunnel      Current Outpatient Prescriptions  Medication Sig Dispense Refill  . b complex vitamins tablet Take 1 tablet by mouth daily.    Marland Kitchen. lisinopril (PRINIVIL,ZESTRIL) 2.5 MG tablet Take 2.5 mg by mouth daily.    Marland Kitchen. MAGNESIUM MALATE PO Take 1,350 mg by mouth at bedtime. Each tablet is 450 mg - pt takes 3 tablets at bedtime    . Multiple Vitamins-Minerals (MULTIVITAMIN WITH MINERALS) tablet Take 1 tablet by mouth daily.    Marland Kitchen. NATURE-THROID 32.5 MG tablet Take 1 tablet by mouth 2 (two) times daily.    . NONFORMULARY OR COMPOUNDED ITEM Apply 1 application topically every morning. Neuro Boilogic Cream with B vitamins    . potassium chloride SA (K-DUR,KLOR-CON) 20 MEQ tablet Take 1 tablet (20 mEq total) by mouth daily. 30 tablet 0  . predniSONE (DELTASONE) 20 MG tablet Take 40 mg by mouth daily with breakfast.    . Probiotic Product (PROBIOTIC DAILY PO) Take 1 capsule by mouth 2 (two) times daily.    Marland Kitchen. rOPINIRole (REQUIP) 0.5 MG tablet Take 1 mg by mouth at bedtime.     . sertraline (ZOLOFT) 25 MG tablet Take 25 mg by mouth daily.    Marland Kitchen. torsemide (DEMADEX) 20 MG tablet Take 80 mg by mouth 2 (two) times daily.    . Turmeric Curcumin 500 MG CAPS Take 1 capsule by mouth 2 (two) times daily.    Marland Kitchen. zinc gluconate 50 MG tablet Take 50 mg by mouth daily.     No current facility-administered medications for this visit.     Allergies as of 07/18/2017 - Review Complete 07/18/2017  Allergen Reaction Noted  . Compazine [prochlorperazine edisylate]  05/23/2014    Vitals: BP 93/62   Pulse 78   Ht 5\' 2"  (1.575 m)   Wt 120 lb (54.4 kg)   BMI 21.95 kg/m  Last Weight:  Wt Readings from Last 1 Encounters:  07/18/17 120 lb (54.4 kg)   MWN:UUVOBMI:Body mass index is 21.95 kg/m.     Last Height:   Ht Readings from Last 1 Encounters:  07/18/17 5\' 2"  (1.575 m)    Physical exam:  General: The  patient is awake, alert and appears not in acute distress. The patient is well groomed. Head: Normocephalic, atraumatic. Neck is supple. Mallampati 1,  neck circumference: 14. Nasal airflow patent , TMJ click present- wears a bite guard at night, bruxism marks. Retrognathia is seen.  Cardiovascular:  Regular rate and rhythm , without  murmurs or carotid bruit, and without distended neck veins. Respiratory: Lungs are clear to auscultation. Skin:  Without evidence  of edema, or rash Trunk: BMI is 22. The patient's posture is erect   Neurologic exam : The patient is awake and alert, oriented to place and time.   Attention span & concentration ability appears normal.  Speech is fluent,  without dysarthria, dysphonia or aphasia.  Mood and affect are appropriate.  Cranial nerves: Pupils are equal and briskly reactive to light. Funduscopic exam without  evidence of pallor or edema. Extraocular movements  in vertical and horizontal planes intact and without nystagmus. Visual fields by finger perimetry are intact. Hearing to finger rub intact. Facial sensation intact to fine touch.Facial motor strength is symmetric and tongue and uvula move midline.  She reports neck pain , jaw pain. Shoulder shrug was symmetrical.  Motor exam:  Normal tone, muscle bulk and symmetric strength in all extremities. Sensory:   Proprioception tested in the upper extremities was normal. Coordination: Finger-to-nose maneuver  normal without evidence of ataxia, dysmetria or tremor. Gait and station: Patient walks without assistive device. Strength within normal limits. Stance is stable and normal. Turns with 3 Steps. Deep tendon reflexes: in the  upper  extremities are 2 plus  symmetric and intact. Her lower reflexes are very brisk- hyper reflexic. Babinski maneuver response is downgoing.  Assessment:  After physical and neurologic examination, review of laboratory studies,  Personal review of imaging studies, reports of other  /same  Imaging studies, results of polysomnography and / or neurophysiology testing and pre-existing records as far as provided in visit., my assessment is   1) Alice Robertson reports restless legs that have preceded the diagnosis of Lyme disease and did preceded her diagnosis of kidney disease. She was originally placed on Requip by Dr. Rogelio Seen at [facility] and has been for over 15 years treated with this medication but with the titration in dose. She is currently taking 0.5 tab, three at night. She feels this is not longer covering her. Her symptoms have been earlier appearing - "anticipation".  2) I'm not sure that a diagnosis of Lyme disease would correspond for contribute much to the restless legs, but the patient had to discontinue her treatment when she was diagnosed with minimal change disease. 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    3) I am able to review her recent lab tests from 05/16/2017 which showed normal iron binding capacity, normal ferritin, her urine test showed a protein to creatinine ratio of 190 mg, a CBC with differential was in normal range for the uric sample testing documented a 1+ proteinuria patient also had normal electrolytes levels, and overall normal creatinine clearance, and normal cholesterol panel. Patient reports improvement of RLS on Prednisone.     The patient was advised of the nature of the diagnosed disorder , the treatment options and the  risks for general health and wellness arising from not treating the condition.  I would like for the patient to undergo an MRI of the cervical spine, I will consult with her nephrologist about the use of contrast. My goal is to make sure that there is no myelopathy or demyelinating lesion it may also promotes the restless legs. She does report back pain that has progressed over the years and not improved.  In addition I will change her from Requip to Mirapex or its generic form pramipexole. I will start with a medium dose, and will allow for 30 days of a trial period.  If this improves the restless legs I would leave her on the medication, the side effects are very similar to the drugs that she has been using now. If there is not a positive therapeutic effect I would try and control patch for its extended release qualities and the coverage of over 12 hours.  I spent more than 45 minutes of face to face time with the patient.  Greater than 50% of time was spent in counseling and coordination of care. We have discussed the diagnosis and differential and I answered the patient's questions.    Plan:  Treatment plan and additional workup : MRI cervical spine - hyperreflexia and worsening upper back pain. 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  Mirapex medium range dosing. Allow for upward titration. RV in 45 days .       Melvyn Novas, MD 07/18/2017, 3:03 PM  Certified in Neurology by [employer]BPN Certified in Sleep Medicine by [facility] [address], [address] [place], [place] [address]

## 2017-08-30 ENCOUNTER — Other Ambulatory Visit: Payer: Self-pay | Admitting: Neurology

## 2017-08-30 MED ORDER — PRAMIPEXOLE DIHYDROCHLORIDE 0.25 MG PO TABS: ORAL_TABLET | ORAL | 1 refills | Status: DC

## 2017-10-24 ENCOUNTER — Ambulatory Visit: Payer: 59 | Admitting: Nurse Practitioner

## 2017-10-24 ENCOUNTER — Ambulatory Visit: Payer: 59 | Admitting: Adult Health

## 2017-11-25 NOTE — Progress Notes (Signed)
[facility]  PATIENT: Alice Robertson DOB: January 11, 1970   REASON FOR VISIT: Follow-up for restless legs, symptoms not well controlled HISTORY FROM: Patient    HISTORY OF PRESENT ILLNESS:UPDATE 11/27/2018CM Ms. Alice Robertson, 47 year old female returns for follow-up with history of restless legs.  She has been on Requip for many years and was changed to Mirapex at her last visit with Dr. Vickey Hugerohmeier however patient misunderstood the directions and has remained on her Requip with the Mirapex and her symptoms are still not well controlled.  She has had restless legs for approximately 20 years.  She goes to yoga several times a week.  No caffeine no alcohol.  She wanted to switch from Requip to Mirapex due to nausea on the Requip.  She is currently taking 1 pill of each.  She returns for reevaluation 07/18/17 CDJennifer R Alice Robertson is a 47 y.o. female , seen here as in a referral from Dr. Signe ColtUpton, her nephrologist. 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 Her first symptoms were body edema, anasarka due to  fluid retention and she exhibited nephrotic range proteinuria.  A serologic workup was negative and a renal biopsy revealed minimal change disease. She also has been diagnosed with " chronic " Lyme disease which has been treated by Dr. Lelon PerlaSaunders. The patient has hypothyroidism, and she was diagnosed with restless legs- which related to this referral.  [name] was diagnosed with restless leg syndrome approximately 15 or 20 years ago and she had been followed by Dr.Vaughn Gwynn [facility]lyMcall [facility]. He initiated treatment with Requip which has been continued by her primary care physician. The patient had to increase her dose upwards over the last years and she also reports that Requip causes some nausea. This has led to vomiting in the evening hours. It is especially bothersome while she travels. Her RLS begins bothering in late  afternoon, sometimes when not moving she can have them in daytime, while on a plane, or while a passenger in the car. She may wake in the middle of the night.  The patient has not tried another prescription for aside from Requip. She has been on prednisone for her renal condition .She did try a nutritional supplement called "Mucana Dopa ".She continues with immediate release requip in a generic form.    Sleep habits are as follows: The patient is usually going to bed between 9 and 10 PM, but she struggles with restless legs which delays sleep onset. She may be in bed for for 30 minutes or 90 minutes before she finally. She frequently wakes up around 3 AM and feels that restless legs are contributing on many occasions. It is difficult for her to go back to sleep, and if she is able to sleep she feels that the sleep quality restored refresh her. She reports that she dreams but these are not nightmarish.  She rises at 6 AM on work days, on other days at 9.30. She feels groggy and tired.  She does not longer nap in daytime, because her RLS keeps her up.  Sleep medical history and family sleep history: paternal uncle with RLS, mother is a loud snorer.  Night terrors as a child and sleep walking- major depression , chronic Lyme.  Social history: married, Sales executivedental assistant , one child, non tobacco user, no ETOH use, Caffeine - none.   REVIEW OF SYSTEMS: Full 14 system review of systems performed and notable only for those  listed, all others are neg:  Constitutional: neg  Cardiovascular: neg Ear/Nose/Throat: neg  Skin: neg Eyes: neg Respiratory: neg Gastroitestinal: neg  Hematology/Lymphatic: neg  Endocrine: neg Musculoskeletal: Joint pain Allergy/Immunology: neg Neurological: neg Psychiatric: neg Sleep : Restless legs   ALLERGIES: Allergies  Allergen Reactions  . Compazine [Prochlorperazine Edisylate]     "bells palsy type seizure"    HOME MEDICATIONS: Outpatient Medications Prior to  Visit  Medication Sig Dispense Refill  . b complex vitamins tablet Take 1 tablet by mouth daily.    Marland Kitchen lisinopril (PRINIVIL,ZESTRIL) 2.5 MG tablet Take 2.5 mg by mouth daily.    Marland Kitchen MAGNESIUM MALATE PO Take 1,350 mg by mouth at bedtime. Each tablet is 450 mg - pt takes 3 tablets at bedtime    . Multiple Vitamins-Minerals (MULTIVITAMIN WITH MINERALS) tablet Take 1 tablet by mouth daily.    Marland Kitchen NATURE-THROID 32.5 MG tablet Take 1 tablet by mouth 2 (two) times daily.    . NONFORMULARY OR COMPOUNDED ITEM Apply 1 application topically every morning. Neuro Boilogic Cream with B vitamins    . potassium chloride SA (K-DUR,KLOR-CON) 20 MEQ tablet Take 1 tablet (20 mEq total) by mouth daily. 30 tablet 0  . pramipexole (MIRAPEX) 0.25 MG tablet Take 1-2 tab of this medication about 2 hours before intended bed time. 60 tablet 1  . predniSONE (DELTASONE) 5 MG tablet Take 5 mg by mouth every other day.    . Probiotic Product (PROBIOTIC DAILY PO) Take 1 capsule by mouth 2 (two) times daily.    Marland Kitchen rOPINIRole (REQUIP) 0.5 MG tablet Take 1 mg by mouth at bedtime.     . sertraline (ZOLOFT) 25 MG tablet Take 25 mg by mouth daily.    Marland Kitchen torsemide (DEMADEX) 20 MG tablet Take 80 mg by mouth daily.     . Turmeric Curcumin 500 MG CAPS Take 1 capsule by mouth 2 (two) times daily.    Marland Kitchen zinc gluconate 50 MG tablet Take 50 mg by mouth daily.    . predniSONE (DELTASONE) 20 MG tablet Take 40 mg by mouth daily with breakfast.     No facility-administered medications prior to visit.     PAST MEDICAL HISTORY: Past Medical History:  Diagnosis Date  . Chronic kidney disease   . Hormone imbalance   . Hypothyroid   . Lyme disease   . Lyme disease   . Restless leg syndrome   . Thyroid disease     PAST SURGICAL HISTORY: Past Surgical History:  Procedure Laterality Date  . CARPAL TUNNEL RELEASE    . carpel tunnel      FAMILY HISTORY: Family History  Problem Relation Age of Onset  . Brain cancer Father     SOCIAL  HISTORY: Social History   Socioeconomic History  . Marital status: Married    Spouse name: Not on file  . Number of children: Not on file  . Years of education: Not on file  . Highest education level: Not on file  Social Needs  . Financial resource strain: Not on file  . Food insecurity - worry: Not on file  . Food insecurity - inability: Not on file  . Transportation needs - medical: Not on file  . Transportation needs - non-medical: Not on file  Occupational History  . Not on file  Tobacco Use  . Smoking status: Never Smoker  . Smokeless tobacco: Never Used  Substance and Sexual Activity  . Alcohol use: No  . Drug use: Not on file  .  Sexual activity: Not on file  Other Topics Concern  . Not on file  Social History Narrative  . Not on file     PHYSICAL EXAM  Vitals:   11/26/17 0916  BP: 99/65  Pulse: 74  Weight: 115 lb 12.8 oz (52.5 kg)  Height: 5\' 2"  (1.575 m)   Body mass index is 21.18 kg/m.  Generalized: Well developed, in no acute distress  Head: normocephalic and atraumatic,. Oropharynx benign  Neck: Supple,  Musculoskeletal: No deformity   Neurological examination   Mentation: Alert oriented to time, place, history taking. Attention span and concentration appropriate. Recent and remote memory intact.  Follows all commands speech and language fluent.   Cranial nerve II-XII: Pupils were equal round reactive to light extraocular movements were full, visual field were full on confrontational test. Facial sensation and strength were normal. hearing was intact to finger rubbing bilaterally. Uvula tongue midline. head turning and shoulder shrug were normal and symmetric.Tongue protrusion into cheek strength was normal. Motor: normal bulk and tone, full strength in the BUE, BLE, fine finger movements normal, no pronator drift. No focal weakness Sensory: normal and symmetric to light touch,  Coordination: finger-nose-finger, heel-to-shin bilaterally, no  dysmetria Reflexes: Brachioradialis 2/2, biceps 2/2, triceps 2/2, patellar 2/2, Achilles 2/2, plantar responses were flexor bilaterally. Gait and Station: Rising up from seated position without assistance, normal stance,  moderate stride, good arm swing, smooth turning, able to perform tiptoe, and heel walking without difficulty. Tandem gait is steady  DIAGNOSTIC DATA (LABS, IMAGING, TESTING) - I reviewed patient records, labs, notes, testing and imaging myself where available.  Lab Results  Component Value Date   WBC 5.3 12/27/2016   HGB 13.6 12/27/2016   HCT 41.1 12/27/2016   MCV 85.6 12/27/2016   PLT 451 (H) 12/27/2016      Component Value Date/Time   NA 128 (L) 12/09/2016 1926   K 4.0 12/09/2016 1926   CL 98 (L) 12/09/2016 1926   CO2 23 12/09/2016 1926   GLUCOSE 89 12/09/2016 1926   BUN 12 12/09/2016 1926   CREATININE 0.83 12/09/2016 1926   CALCIUM 7.4 (L) 12/09/2016 1926   PROT 4.3 (L) 12/09/2016 1926   ALBUMIN 1.2 (L) 12/09/2016 1926   AST 27 12/09/2016 1926   ALT 13 (L) 12/09/2016 1926   ALKPHOS 43 12/09/2016 1926   BILITOT 0.3 12/09/2016 1926   GFRNONAA >60 12/09/2016 1926   GFRAA >60 12/09/2016 1926    Lab Results  Component Value Date   TSH 4.096 12/09/2016     ASSESSMENT AND PLAN [name] reports restless legs that have preceded the diagnosis of Lyme disease and did preceded her diagnosis of kidney disease. She was originally placed on Requip by [facility] Rogel[facility]all [facility] and has been for over 15 years treated with this medication.  She was placed on Mirapex at her last visit by Dr. Vickey Hugerohmeier but has also continue the Requip.  Patient was made aware of these medications are not taken in combination.   PLAN: Discussed with Dr. Vickey Hugerohmeier  Discontinue Requip Increase Mirapex to 2 tabs 3 hours before bedtime for 1 week then increase to 3 tabs at bedtime. Given patient handout on restless legs and reviewed this with her F/U in 3 months I spent 25  min  in total face to face time with the patient more than 50% of which was spent counseling and coordination of care, reviewing test results reviewing medications and discussing and reviewing the diagnosis of restless  leg syndrome and further treatment options. , Cline Crock, [facility], APRN  [facility] [address] [place], [place] [address] [phone]

## 2017-11-26 ENCOUNTER — Ambulatory Visit (INDEPENDENT_AMBULATORY_CARE_PROVIDER_SITE_OTHER): Payer: 59 | Admitting: Nurse Practitioner

## 2017-11-26 ENCOUNTER — Encounter (INDEPENDENT_AMBULATORY_CARE_PROVIDER_SITE_OTHER): Payer: Self-pay

## 2017-11-26 ENCOUNTER — Other Ambulatory Visit: Payer: Self-pay | Admitting: Neurology

## 2017-11-26 ENCOUNTER — Encounter: Payer: Self-pay | Admitting: Nurse Practitioner

## 2017-11-26 VITALS — BP 99/65 | HR 74 | Ht 62.0 in | Wt 115.8 lb

## 2017-11-26 DIAGNOSIS — G2581 Restless legs syndrome: Secondary | ICD-10-CM

## 2017-11-26 MED ORDER — PRAMIPEXOLE DIHYDROCHLORIDE 0.25 MG PO TABS: ORAL_TABLET | ORAL | 3 refills | Status: DC

## 2017-11-26 NOTE — Patient Instructions (Addendum)
Discontinue Requip Increase Mirapex to 2 tabs 3 hours before bedtime for 1 week then increase to 3 tabs. F/U in 3 monthsRestless Legs Syndrome Restless legs syndrome is a condition that causes uncomfortable feelings or sensations in the legs, especially while sitting or lying down. The sensations usually cause an overwhelming urge to move the legs. The arms can also sometimes be affected. The condition can range from mild to severe. The symptoms often interfere with a person's ability to sleep. What are the causes? The cause of this condition is not known. What increases the risk? This condition is more likely to develop in:  People who are older than age 45.  Pregnant women. In general, restless legs syndrome is more common in women than in men.  People who have a family history of the condition.  People who have certain medical conditions, such as iron deficiency, kidney disease, Parkinson disease, or nerve damage.  People who take certain medicines, such as medicines for high blood pressure, nausea, colds, allergies, depression, and some heart conditions.  What are the signs or symptoms? The main symptom of this condition is uncomfortable sensations in the legs. These sensations may be:  Described as pulling, tingling, prickling, throbbing, crawling, or burning.  Worse while you are sitting or lying down.  Worse during periods of rest or inactivity.  Worse at night, often interfering with your sleep.  Accompanied by a very strong urge to move your legs.  Temporarily relieved by movement of your legs.  The sensations usually affect both sides of the body. The arms can also be affected, but this is rare. People who have this condition often have tiredness during the day because of their lack of sleep at night. How is this diagnosed? This condition may be diagnosed based on your description of the symptoms. You may also have tests, including blood tests, to check for other  conditions that may lead to your symptoms. In some cases, you may be asked to spend some time in a sleep lab so your sleeping can be monitored. How is this treated? Treatment for this condition is focused on managing the symptoms. Treatment may include:  Self-help and lifestyle changes.  Medicines.  Follow these instructions at home:  Take medicines only as directed by your health care provider.  Try these methods to get temporary relief from the uncomfortable sensations: ? Massage your legs. ? Walk or stretch. ? Take a cold or hot bath.  Practice good sleep habits. For example, go to bed and get up at the same time every day.  Exercise regularly.  Practice ways of relaxing, such as yoga or meditation.  Avoid caffeine and alcohol.  Do not use any tobacco products, including cigarettes, chewing tobacco, or electronic cigarettes. If you need help quitting, ask your health care provider.  Keep all follow-up visits as directed by your health care provider. This is important. Contact a health care provider if: Your symptoms do not improve with treatment, or they get worse. This information is not intended to replace advice given to you by your health care provider. Make sure you discuss any questions you have with your health care provider. Document Released: 12/07/2002 Document Revised: 05/24/2016 Document Reviewed: 12/13/2014 Elsevier Interactive Patient Education  [employer]2018 Elsevier Inc.

## 2017-11-26 NOTE — Progress Notes (Signed)
Mirapex refill Rx successfully faxed to [facility], [address]

## 2017-11-26 NOTE — Progress Notes (Signed)
I agree with the assessment and plan as directed by NP .The patient is known to me .   Casara Perrier, MD  

## 2018-02-02 ENCOUNTER — Other Ambulatory Visit: Payer: Self-pay | Admitting: Neurology

## 2018-02-05 ENCOUNTER — Encounter (HOSPITAL_COMMUNITY)
Admission: RE | Admit: 2018-02-05 | Discharge: 2018-02-05 | Disposition: A | Discharge status: Home / Self Care | Payer: 59 | Source: Ambulatory Visit | Attending: Nephrology | Admitting: Nephrology

## 2018-02-05 ENCOUNTER — Encounter (HOSPITAL_COMMUNITY): Payer: Self-pay

## 2018-02-05 DIAGNOSIS — N050 Unspecified nephritic syndrome with minor glomerular abnormality: Secondary | ICD-10-CM | POA: Insufficient documentation

## 2018-02-05 MED ORDER — ACETAMINOPHEN 325 MG PO TABS
650.0000 mg | ORAL_TABLET | ORAL | Status: DC
  Administered 2018-02-05: 650 mg via ORAL
  Filled 2018-02-05: qty 2

## 2018-02-05 MED ORDER — SODIUM CHLORIDE 0.9 % IV SOLN
INTRAVENOUS | Status: DC
  Administered 2018-02-05: 10:00:00 via INTRAVENOUS

## 2018-02-05 MED ORDER — METHYLPREDNISOLONE SODIUM SUCC 125 MG IJ SOLR
125.0000 mg | INTRAMUSCULAR | Status: DC
  Administered 2018-02-05: 125 mg via INTRAVENOUS
  Filled 2018-02-05: qty 2

## 2018-02-05 MED ORDER — DIPHENHYDRAMINE HCL 50 MG/ML IJ SOLN
25.0000 mg | INTRAMUSCULAR | Status: DC
  Administered 2018-02-05: 25 mg via INTRAVENOUS
  Filled 2018-02-05: qty 1

## 2018-02-05 MED ORDER — SODIUM CHLORIDE 0.9 % IV SOLN
1000.0000 mg | Freq: Once | INTRAVENOUS | Status: AC
  Administered 2018-02-05: 1000 mg via INTRAVENOUS
  Filled 2018-02-05: qty 100

## 2018-02-05 NOTE — Progress Notes (Signed)
Patient tolerated 1st infusion of Rituxan, no problems noted. Rate increased only to 200mg /hr max per order of doctor. Pt. Discharged to home with husband. Patient's next dose is in 2 weeks, pt. To call doctor with any problems.

## 2018-02-05 NOTE — Discharge Instructions (Signed)

## 2018-02-19 ENCOUNTER — Encounter (HOSPITAL_COMMUNITY): Payer: Self-pay

## 2018-02-19 ENCOUNTER — Ambulatory Visit (HOSPITAL_COMMUNITY)
Admission: RE | Admit: 2018-02-19 | Discharge: 2018-02-19 | Disposition: A | Discharge status: Home / Self Care | Payer: 59 | Source: Ambulatory Visit | Attending: Nephrology | Admitting: Nephrology

## 2018-02-19 DIAGNOSIS — N050 Unspecified nephritic syndrome with minor glomerular abnormality: Secondary | ICD-10-CM | POA: Insufficient documentation

## 2018-02-19 HISTORY — DX: Nausea with vomiting, unspecified: R11.2

## 2018-02-19 HISTORY — DX: Other specified postprocedural states: Z98.890

## 2018-02-19 HISTORY — DX: Personal history of nephrotic syndrome: Z87.441

## 2018-02-19 MED ORDER — SODIUM CHLORIDE 0.9 % IV SOLN
1000.0000 mg | Freq: Once | INTRAVENOUS | Status: AC
  Administered 2018-02-19: 1000 mg via INTRAVENOUS
  Filled 2018-02-19: qty 100

## 2018-02-19 MED ORDER — ACETAMINOPHEN 325 MG PO TABS
650.0000 mg | ORAL_TABLET | ORAL | Status: AC
  Administered 2018-02-19: 650 mg via ORAL
  Filled 2018-02-19: qty 2

## 2018-02-19 MED ORDER — SODIUM CHLORIDE 0.9 % IV SOLN
INTRAVENOUS | Status: AC
  Administered 2018-02-19: 10:00:00 via INTRAVENOUS

## 2018-02-19 MED ORDER — METHYLPREDNISOLONE SODIUM SUCC 125 MG IJ SOLR
125.0000 mg | INTRAMUSCULAR | Status: AC
  Administered 2018-02-19: 125 mg via INTRAVENOUS
  Filled 2018-02-19: qty 2

## 2018-02-19 MED ORDER — DIPHENHYDRAMINE HCL 50 MG/ML IJ SOLN
25.0000 mg | INTRAMUSCULAR | Status: AC
  Administered 2018-02-19: 25 mg via INTRAVENOUS
  Filled 2018-02-19: qty 1

## 2018-02-19 NOTE — Discharge Instructions (Signed)
Rituximab injection / rituxan What is this medicine? RITUXIMAB (ri TUX i mab) is a monoclonal antibody. It is used to treat certain types of cancer like non-Hodgkin lymphoma and chronic lymphocytic leukemia. It is also used to treat rheumatoid arthritis, granulomatosis with polyangiitis (or Wegener's granulomatosis), and microscopic polyangiitis. This medicine may be used for other purposes; ask your health care provider or pharmacist if you have questions. COMMON BRAND NAME(S): Rituxan What should I tell my health care provider before I take this medicine? They need to know if you have any of these conditions: -heart disease -infection (especially a virus infection such as hepatitis B, chickenpox, cold sores, or herpes) -immune system problems -irregular heartbeat -kidney disease -lung or breathing disease, like asthma -recently received or scheduled to receive a vaccine -an unusual or allergic reaction to rituximab, mouse proteins, other medicines, foods, dyes, or preservatives -pregnant or trying to get pregnant -breast-feeding How should I use this medicine? This medicine is for infusion into a vein. It is administered in a hospital or clinic by a specially trained health care professional. A special MedGuide will be given to you by the pharmacist with each prescription and refill. Be sure to read this information carefully each time. Talk to your pediatrician regarding the use of this medicine in children. This medicine is not approved for use in children. Overdosage: If you think you have taken too much of this medicine contact a poison control center or emergency room at once. NOTE: This medicine is only for you. Do not share this medicine with others. What if I miss a dose? It is important not to miss a dose. Call your doctor or health care professional if you are unable to keep an appointment. What may interact with this medicine? -cisplatin -other medicines for arthritis like  disease modifying antirheumatic drugs or tumor necrosis factor inhibitors -live virus vaccines This list may not describe all possible interactions. Give your health care provider a list of all the medicines, herbs, non-prescription drugs, or dietary supplements you use. Also tell them if you smoke, drink alcohol, or use illegal drugs. Some items may interact with your medicine. What should I watch for while using this medicine? Your condition will be monitored carefully while you are receiving this medicine. You may need blood work done while you are taking this medicine. This medicine can cause serious allergic reactions. To reduce your risk you may need to take medicine before treatment with this medicine. Take your medicine as directed. In some patients, this medicine may cause a serious brain infection that may cause death. If you have any problems seeing, thinking, speaking, walking, or standing, tell your doctor right away. If you cannot reach your doctor, urgently seek other source of medical care. Call your doctor or health care professional for advice if you get a fever, chills or sore throat, or other symptoms of a cold or flu. Do not treat yourself. This drug decreases your body's ability to fight infections. Try to avoid being around people who are sick. Do not become pregnant while taking this medicine or for 12 months after stopping it. Women should inform their doctor if they wish to become pregnant or think they might be pregnant. There is a potential for serious side effects to an unborn child. Talk to your health care professional or pharmacist for more information. What side effects may I notice from receiving this medicine? Side effects that you should report to your doctor or health care professional as soon as  possible: -breathing problems -chest pain -dizziness or feeling faint -fast, irregular heartbeat -low blood counts - this medicine may decrease the number of white blood  cells, red blood cells and platelets. You may be at increased risk for infections and bleeding. -mouth sores -redness, blistering, peeling or loosening of the skin, including inside the mouth (this can be added for any serious or exfoliative rash that could lead to hospitalization) -signs of infection - fever or chills, cough, sore throat, pain or difficulty passing urine -signs and symptoms of kidney injury like trouble passing urine or change in the amount of urine -signs and symptoms of liver injury like dark yellow or brown urine; general ill feeling or flu-like symptoms; light-colored stools; loss of appetite; nausea; right upper belly pain; unusually weak or tired; yellowing of the eyes or skin -stomach pain -vomiting Side effects that usually do not require medical attention (report to your doctor or health care professional if they continue or are bothersome): -headache -joint pain -muscle cramps or muscle pain This list may not describe all possible side effects. Call your doctor for medical advice about side effects. You may report side effects to FDA at 1-800-FDA-1088. Where should I keep my medicine? This drug is given in a hospital or clinic and will not be stored at home. NOTE: This sheet is a summary. It may not cover all possible information. If you have questions about this medicine, talk to your doctor, pharmacist, or health care provider.  2018 Elsevier/Gold Standard (2016-07-25 15:28:09)

## 2018-02-19 NOTE — Progress Notes (Signed)
Temp 99.3 oral 50 minutes post infusion called in to Dr Signe ColtUpton who advises to discharge and follow up at home.  Pt verbalizes understanding.

## 2018-02-19 NOTE — Progress Notes (Signed)
Prior to infusion, Pt states she had labs drawn last week.  Spoke to [employer] BrandsStacey Blevins with Dr Beaulah CorinUpton's office who confirms that lab results were in and labs did not need to be repeated today per orders. Pt tolerated infusion without problems.

## 2018-03-07 ENCOUNTER — Ambulatory Visit: Payer: 59 | Admitting: Nurse Practitioner

## 2018-03-11 ENCOUNTER — Telehealth: Payer: Self-pay | Admitting: Nurse Practitioner

## 2018-03-11 ENCOUNTER — Other Ambulatory Visit: Payer: Self-pay | Admitting: Neurology

## 2018-03-11 NOTE — Telephone Encounter (Signed)
LVM requesting call back.

## 2018-03-11 NOTE — Telephone Encounter (Signed)
Patient called back stating she thought she had lost the Mirapex. She has found it at home and no longer needs a refill. It was last filled on 02/08/18 for 3 months. .Marland Kitchen

## 2018-03-11 NOTE — Telephone Encounter (Signed)
Patient requesting new Rx for pramipexole (MIRAPEX) 0.25 MG tablet #[facility] [address] [facility]. [facility] called today for a refill but patient also needs an emergency [facility] until patient can receive mail order.

## 2018-04-11 ENCOUNTER — Other Ambulatory Visit: Payer: Self-pay | Admitting: Neurology

## 2018-05-04 ENCOUNTER — Other Ambulatory Visit: Payer: Self-pay | Admitting: Neurology

## 2018-05-30 ENCOUNTER — Ambulatory Visit: Payer: 59 | Admitting: Nurse Practitioner

## 2018-06-10 ENCOUNTER — Ambulatory Visit: Payer: 59 | Admitting: Nurse Practitioner

## 2018-07-20 ENCOUNTER — Inpatient Hospital Stay (HOSPITAL_COMMUNITY)
Admission: AD | Admit: 2018-07-20 | Discharge: 2018-07-20 | Disposition: A | Discharge status: Home / Self Care | Payer: 59 | Source: Ambulatory Visit | Attending: Obstetrics & Gynecology | Admitting: Obstetrics & Gynecology

## 2018-07-20 ENCOUNTER — Other Ambulatory Visit: Payer: Self-pay

## 2018-07-20 ENCOUNTER — Encounter (HOSPITAL_COMMUNITY): Payer: Self-pay | Admitting: *Deleted

## 2018-07-20 DIAGNOSIS — N921 Excessive and frequent menstruation with irregular cycle: Secondary | ICD-10-CM

## 2018-07-20 DIAGNOSIS — T8384XA Pain from genitourinary prosthetic devices, implants and grafts, initial encounter: Secondary | ICD-10-CM | POA: Diagnosis not present

## 2018-07-20 DIAGNOSIS — Z975 Presence of (intrauterine) contraceptive device: Secondary | ICD-10-CM

## 2018-07-20 DIAGNOSIS — N939 Abnormal uterine and vaginal bleeding, unspecified: Secondary | ICD-10-CM | POA: Diagnosis not present

## 2018-07-20 LAB — CBC
HCT: 38.1 % (ref 36.0–46.0)
Hemoglobin: 12.7 g/dL (ref 12.0–15.0)
MCH: 28.5 pg (ref 26.0–34.0)
MCHC: 33.3 g/dL (ref 30.0–36.0)
MCV: 85.6 fL (ref 78.0–100.0)
Platelets: 384 10*3/uL (ref 150–400)
RBC: 4.45 MIL/uL (ref 3.87–5.11)
RDW: 13.2 % (ref 11.5–15.5)
WBC: 9.2 10*3/uL (ref 4.0–10.5)

## 2018-07-20 NOTE — Discharge Instructions (Signed)

## 2018-07-20 NOTE — MAU Provider Note (Signed)
History     CSN: [id number]  Arrival date and time: 07/20/18 1239   First Provider Initiated Contact with Patient 07/20/18 1305      Chief Complaint  Patient presents with  . Vaginal Bleeding   Alice BlakesJennifer R Robertson is a 48 y.o. G1P0 who is here with bleeding after IUD insertion. She had a pargard changed out on Friday and yesterday and today she has had a lot of bleeding with clots. She also reports that also feels like the IUD is poking into the left side of her uterus. Prior Paragard IUD had been in place for "years". No problems or complaints with IUD, but it was time for it to be changed.   Vaginal Bleeding  The patient's primary symptoms include pelvic pain and vaginal bleeding. This is a new problem. The current episode started yesterday. The problem occurs constantly. The problem has been unchanged. The pain is moderate. The problem affects the left side. She is not pregnant. Pertinent negatives include no chills, fever, nausea or vomiting. The vaginal discharge was bloody. The vaginal bleeding is typical of menses. She has been passing clots. She has not been passing tissue. Exacerbated by: coughing, sneezing or bearing down causes the "poking pain" to be worse  Treatments tried: CBD oil, and tylenol.  The treatment provided no relief. She is sexually active. She uses an IUD for contraception. Her menstrual history has been regular (LMP approx last week of June).    Past Medical History:  Diagnosis Date  . Chronic kidney disease   . 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 Hormone imbalance   . Hypothyroid   . Lyme disease   . Lyme disease   . PONV (postoperative nausea and vomiting)   . Restless leg syndrome   . Thyroid disease     Past Surgical History:  Procedure Laterality Date  . CARPAL TUNNEL RELEASE    . carpel tunnel      Family History  Problem Relation Age of Onset  . Brain cancer Father     Social History   Tobacco Use  . Smoking status: Never Smoker   . Smokeless tobacco: Never Used  Substance Use Topics  . Alcohol use: No  . Drug use: Never    Allergies:  Allergies  Allergen Reactions  . Compazine [Prochlorperazine Edisylate]     "bells palsy type seizure"    Medications Prior to Admission  Medication Sig Dispense Refill Last Dose  . b complex vitamins tablet Take 1 tablet by mouth daily.   Taking  . Cholecalciferol (VITAMIN D-3) 1000 units CAPS Take 5,000 Units by mouth daily.     Marland Kitchen. GRAPE SEED ER PO Take 650 mg by mouth daily. mascadine grape seed & skin 650 mg daily     . lisinopril (PRINIVIL,ZESTRIL) 2.5 MG tablet Take 2.5 mg by mouth daily.   Taking  . MAGNESIUM MALATE PO Take 1,350 mg by mouth at bedtime. Each tablet is 450 mg - pt takes 3 tablets at bedtime   Taking  . Multiple Vitamins-Minerals (MULTIVITAMIN WITH MINERALS) tablet Take 1 tablet by mouth daily.   Taking  . NATURE-THROID 32.5 MG tablet Take 1 tablet by mouth 2 (two) times daily.   Taking  . NONFORMULARY OR COMPOUNDED ITEM Apply 1 application topically every morning. Neuro Boilogic Cream with B vitamins   Taking  . potassium chloride SA (K-DUR,KLOR-CON) 20 MEQ tablet Take 1 tablet (20 mEq total) by mouth daily. 30 tablet 0  Taking  . pramipexole (MIRAPEX) 0.25 MG tablet TAKE 3 TABLETS BY MOUTH  ABOUT 2 HOURS PRIOR TO  INTENDED BEDTIME 270 tablet 2   . predniSONE (DELTASONE) 50 MG tablet Take 40 mg by mouth every other day.    Taking  . Probiotic Product (PROBIOTIC DAILY PO) Take 1 capsule by mouth 2 (two) times daily.   Taking  . sertraline (ZOLOFT) 25 MG tablet Take 25 mg by mouth daily.   Taking  . torsemide (DEMADEX) 20 MG tablet Take 80 mg by mouth 3 times/day as needed-between meals & bedtime.    Taking  . Turmeric Curcumin 500 MG CAPS Take 1 capsule by mouth 2 (two) times daily.   Taking  . UBIQUINOL LIPOSOMAL PO Take 1 tablet by mouth. Liposomal glutathione 1 tab po daily     . zinc gluconate 50 MG tablet Take 50 mg by mouth daily.   Taking    Review  of Systems  Constitutional: Negative for chills and fever.  Gastrointestinal: Negative for nausea and vomiting.  Genitourinary: Positive for pelvic pain and vaginal bleeding.   Physical Exam   Blood pressure (!) 103/57, pulse 73, temperature 98.3 F (36.8 C), temperature source Oral, resp. rate 16, weight 116 lb 4 oz (52.7 kg), last menstrual period 06/25/2018, SpO2 100 %.  Physical Exam  Nursing note and vitals reviewed. Constitutional: She is oriented to person, place, and time. She appears well-developed and well-nourished. No distress.  HENT:  Head: Normocephalic.  Cardiovascular: Normal rate.  Respiratory: Effort normal.  GI: Soft. There is no tenderness. There is no rebound.  Genitourinary:  Genitourinary Comments:  External: no lesion Vagina: scant amount of blood  Cervix: pink, smooth, no CMT, IUD string extending 2cm from os  Uterus: NSSC Adnexa: NT   Neurological: She is alert and oriented to person, place, and time.  Skin: Skin is warm and dry.  Psychiatric: She has a normal mood and affect.     Results for orders placed or performed during the hospital encounter of 07/20/18 (from the past 24 hour(s))  CBC     Status: None   Collection Time: 07/20/18  1:11 PM  Result Value Ref Range   WBC 9.2 4.0 - 10.5 K/uL   RBC 4.45 3.87 - 5.11 MIL/uL   Hemoglobin 12.7 12.0 - 15.0 g/dL   HCT 16.1 09.6 - 04.5 %   MCV 85.6 78.0 - 100.0 fL   MCH 28.5 26.0 - 34.0 pg   MCHC 33.3 30.0 - 36.0 g/dL   RDW 40.9 81.1 - 91.4 %   Platelets 384 150 - 400 K/uL     MAU Course  Procedures  MDM 1:31 PM Consult with Dr. Charlotta Newton. Reviewed physical exam, bleeding and CBC results. Patient is OK for DC home. Return to the office in 4 weeks for IUD check.    Assessment and Plan   1. Vaginal bleeding   2. Pain due to intrauterine contraceptive device (IUD), initial encounter (HCC)   3. Breakthrough bleeding with IUD    DC home Comfort measures reviewed  Bleeding precautions RX: no new  RX. Continue current medications  Return to MAU as needed FU with OB as planned  Follow-up Information    Itzayana, Pardy, DO. Schedule an appointment as soon as possible for a visit in 4 week(s).   Specialty:  Obstetrics and Gynecology Contact information: [address] [place] [address] [phone]           Thressa Sheller  07/20/2018, 1:08 PM

## 2018-07-20 NOTE — MAU Note (Signed)
Had IUD replaced on Friday. Having a lot of pain on left side, feels like is poking into left side of uterus(started with insertion).  Is bleeding (started yesterday), became heavy today.  Passing large clots.  Feels dizzy, light headed. Just doesn't feel right.

## 2018-10-14 ENCOUNTER — Ambulatory Visit: Payer: 59 | Admitting: Nurse Practitioner

## 2018-10-23 NOTE — Progress Notes (Signed)
[facility]  PATIENT: Alice Robertson DOB: [birth year]   REASON FOR VISIT: Follow-up for restless legs, symptoms fairly  well controlled HISTORY FROM: Patient    HISTORY OF PRESENT ILLNESS:UPDATE 10/25/2019CM Alice Robertson, 48 year old female returns for follow-up with a history of restless leg syndrome.  She was on Requip for about 15 years and most recently has switched to Mirapex. Her current dose is 0.25 mg  2 at hs. she has had restless legs for approximately 20 years.  She continues to be very active goes to yoga works out etc. denies caffeine or alcohol.  She does have periods where her restless legs are worse and she would will take 3 tablets.  She returns for reevaluation   UPDATE 11/27/2018CM Alice Robertson, 48 year old female returns for follow-up with history of restless legs.  She has been on Requip for many years and was changed to Mirapex at her last visit with Dr. Vickey Robertson however patient misunderstood the directions and has remained on her Requip with the Mirapex and her symptoms are still not well controlled.  She has had restless legs for approximately 20 years.  She goes to yoga several times a week.  No caffeine no alcohol.  She wanted to switch from Requip to Mirapex due to nausea on the Requip.  She is currently taking 1 pill of each.  She returns for reevaluation 07/18/17 CDJennifer R Robertson is a 48 y.o. female , seen here as in a referral from Dr. Signe Robertson, her nephrologist. 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 Her first symptoms were body edema, anasarka due to  fluid retention and she exhibited nephrotic range proteinuria.  A serologic workup was negative and a renal biopsy revealed minimal change disease. She also has been diagnosed with " chronic " Lyme disease which has been treated by Dr. Lelon Robertson. The patient has hypothyroidism, and she was diagnosed with restless legs- which related to this referral.  Alice Robertson was  diagnosed with restless leg syndrome approximately 15 or 20 years ago and she had been followed by AliceVaughn Gwynn Robertson at [facility]. He initiated treatment with Requip which has been continued by her primary care physician. The patient had to increase her dose upwards over the last years and she also reports that Requip causes some nausea. This has led to vomiting in the evening hours. It is especially bothersome while she travels. Her RLS begins bothering in late afternoon, sometimes when not moving she can have them in daytime, while on a plane, or while a passenger in the car. She may wake in the middle of the night.  The patient has not tried another prescription for aside from Requip. She has been on prednisone for her renal condition .She did try a nutritional supplement called "Mucana Dopa ".She continues with immediate release requip in a generic form.    Sleep habits are as follows: The patient is usually going to bed between 9 and 10 PM, but she struggles with restless legs which delays sleep onset. She may be in bed for for 30 minutes or 90 minutes before she finally. She frequently wakes up around 3 AM and feels that restless legs are contributing on many occasions. It is difficult for her to go back to sleep, and if she is able to sleep she feels that the sleep quality restored refresh her. She reports that she dreams but these are not nightmarish.  She rises at 6  AM on work days, on other days at 9.30. She feels groggy and tired.  She does not longer nap in daytime, because her RLS keeps her up.  Sleep medical history and family sleep history: paternal uncle with RLS, mother is a loud snorer.  Night terrors as a child and sleep walking- major depression , chronic Lyme.  Social history: married, Sales executive , one child, non tobacco user, no ETOH use, Caffeine - none.   REVIEW OF SYSTEMS: Full 14 system review of systems performed and notable only for  those listed, all others are neg:  Constitutional: neg  Cardiovascular: neg Ear/Nose/Throat: neg  Skin: neg Eyes: neg Respiratory: neg Gastroitestinal: neg  Hematology/Lymphatic: neg  Endocrine: neg Musculoskeletal: neg Allergy/Immunology: neg Neurological: neg Psychiatric: neg Sleep : Restless legs   ALLERGIES: Allergies  Allergen Reactions  . Compazine [Prochlorperazine Edisylate]     "bells palsy type seizure"    HOME MEDICATIONS: Outpatient Medications Prior to Visit  Medication Sig Dispense Refill  . Cholecalciferol (VITAMIN D-3) 1000 units CAPS Take 5,000 Units by mouth daily.    Marland Kitchen lisinopril (PRINIVIL,ZESTRIL) 2.5 MG tablet Take 2.5 mg by mouth daily.    Marland Kitchen MAGNESIUM MALATE PO Take 1,350 mg by mouth at bedtime. Each tablet is 450 mg - pt takes 3 tablets at bedtime    . Multiple Vitamins-Minerals (MULTIVITAMIN WITH MINERALS) tablet Take 1 tablet by mouth daily.    Marland Kitchen NALTREXONE HCL PO Take 1 tablet by mouth at bedtime.    Marland Kitchen NATURE-THROID 32.5 MG tablet Take 1 tablet by mouth 2 (two) times daily.    . NONFORMULARY OR COMPOUNDED ITEM Apply 1 application topically every morning. Neuro Boilogic Cream with B vitamins    . potassium chloride SA (K-DUR,KLOR-CON) 20 MEQ tablet Take 1 tablet (20 mEq total) by mouth daily. 30 tablet 0  . pramipexole (MIRAPEX) 0.25 MG tablet TAKE 3 TABLETS BY MOUTH  ABOUT 2 HOURS PRIOR TO  INTENDED BEDTIME 270 tablet 2  . Probiotic Product (PROBIOTIC DAILY PO) Take 1 capsule by mouth 2 (two) times daily.    . sertraline (ZOLOFT) 25 MG tablet Take 25 mg by mouth daily.    . Turmeric Curcumin 500 MG CAPS Take 1 capsule by mouth 2 (two) times daily.    Marland Kitchen UBIQUINOL LIPOSOMAL PO Take 1 tablet by mouth. Liposomal glutathione 1 tab po daily    . UNABLE TO FIND Take 1 tablet by mouth daily. Med Name: Potassium KCL    . zinc gluconate 50 MG tablet Take 50 mg by mouth daily.    Marland Kitchen b complex vitamins tablet Take 1 tablet by mouth daily.    Marland Kitchen GRAPE SEED ER PO  Take 650 mg by mouth daily. mascadine grape seed & skin 650 mg daily    . torsemide (DEMADEX) 20 MG tablet Take 80 mg by mouth 3 times/day as needed-between meals & bedtime.     . predniSONE (DELTASONE) 50 MG tablet Take 40 mg by mouth every other day.      No facility-administered medications prior to visit.     PAST MEDICAL HISTORY: Past Medical History:  Diagnosis Date  . Chronic kidney disease   . 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 Hormone imbalance   . Hypothyroid   . Lyme disease   . Lyme disease   . PONV (postoperative nausea and vomiting)   . Restless leg syndrome   . Thyroid disease     PAST SURGICAL HISTORY: Past  Surgical History:  Procedure Laterality Date  . CARPAL TUNNEL RELEASE    . carpel tunnel      FAMILY HISTORY: Family History  Problem Relation Age of Onset  . Brain cancer Father     SOCIAL HISTORY: Social History   Socioeconomic History  . Marital status: Married    Spouse name: Not on file  . Number of children: Not on file  . Years of education: Not on file  . Highest education level: Not on file  Occupational History  . Not on file  Social Needs  . Financial resource strain: Not on file  . Food insecurity:    Worry: Not on file    Inability: Not on file  . Transportation needs:    Medical: Not on file    Non-medical: Not on file  Tobacco Use  . Smoking status: Never Smoker  . Smokeless tobacco: Never Used  Substance and Sexual Activity  . Alcohol use: No  . Drug use: Never  . Sexual activity: Yes    Birth control/protection: IUD  Lifestyle  . Physical activity:    Days per week: Not on file    Minutes per session: Not on file  . Stress: Not on file  Relationships  . Social connections:    Talks on phone: Not on file    Gets together: Not on file    Attends religious service: Not on file    Active member of club or organization: Not on file    Attends meetings of clubs or organizations: Not on file     Relationship status: Not on file  . Intimate partner violence:    Fear of current or ex partner: Not on file    Emotionally abused: Not on file    Physically abused: Not on file    Forced sexual activity: Not on file  Other Topics Concern  . Not on file  Social History Narrative  . Not on file     PHYSICAL EXAM  Vitals:   10/24/18 0826  BP: 94/63  Pulse: 71  Weight: 112 lb 6.4 oz (51 kg)  Height: 5\' 2"  (1.575 m)   Body mass index is 20.56 kg/m.  Generalized: Well developed, in no acute distress  Head: normocephalic and atraumatic,. Oropharynx benign  Neck: Supple,  Musculoskeletal: No deformity   Neurological examination   Mentation: Alert oriented to time, place, history taking. Attention span and concentration appropriate. Recent and remote memory intact.  Follows all commands speech and language fluent.   Cranial nerve II-XII: Pupils were equal round reactive to light extraocular movements were full, visual field were full on confrontational test. Facial sensation and strength were normal. hearing was intact to finger rubbing bilaterally. Uvula tongue midline. head turning and shoulder shrug were normal and symmetric.Tongue protrusion into cheek strength was normal. Motor: normal bulk and tone, full strength in the BUE, BLE, fine finger movements normal, no pronator drift. No focal weakness Sensory: normal and symmetric to light touch,  Coordination: finger-nose-finger, heel-to-shin bilaterally, no dysmetria Reflexes: Symmetric upper and lower, plantar responses were flexor bilaterally. Gait and Station: Rising up from seated position without assistance, normal stance,  moderate stride, good arm swing, smooth turning, able to perform tiptoe, and heel walking without difficulty. Tandem gait is steady  DIAGNOSTIC DATA (LABS, IMAGING, TESTING) - I reviewed patient records, labs, notes, testing and imaging myself where available.  Lab Results  Component Value Date   WBC  9.2 07/20/2018   HGB 12.7 07/20/2018  HCT 38.1 07/20/2018   MCV 85.6 07/20/2018   PLT 384 07/20/2018      Component Value Date/Time   NA 128 (L) 12/09/2016 1926   K 4.0 12/09/2016 1926   CL 98 (L) 12/09/2016 1926   CO2 23 12/09/2016 1926   GLUCOSE 89 12/09/2016 1926   BUN 12 12/09/2016 1926   CREATININE 0.83 12/09/2016 1926   CALCIUM 7.4 (L) 12/09/2016 1926   PROT 4.3 (L) 12/09/2016 1926   ALBUMIN 1.2 (L) 12/09/2016 1926   AST 27 12/09/2016 1926   ALT 13 (L) 12/09/2016 1926   ALKPHOS 43 12/09/2016 1926   BILITOT 0.3 12/09/2016 1926   GFRNONAA >60 12/09/2016 1926   GFRAA >60 12/09/2016 1926    Lab Results  Component Value Date   TSH 4.096 12/09/2016     ASSESSMENT AND PLAN Alice Robertson reports restless legs that have preceded the diagnosis of Lyme disease and did preceded her diagnosis of kidney disease. She was originally placed on Requip by Dr. Rogelio Seen at [facility] for 15 years then switched to Mirapex by Dr. Vickey Robertson   PLAN: Mirapex  3 tabs at bedtime. F/U in one year Call for worsening symptoms Nilda Riggs, [facility], [facility], APRN  [facility] [address], [address] [place], [place] [address] [phone]

## 2018-10-24 ENCOUNTER — Ambulatory Visit (INDEPENDENT_AMBULATORY_CARE_PROVIDER_SITE_OTHER): Payer: 59 | Admitting: Nurse Practitioner

## 2018-10-24 ENCOUNTER — Encounter: Payer: Self-pay | Admitting: Nurse Practitioner

## 2018-10-24 VITALS — BP 94/63 | HR 71 | Ht 62.0 in | Wt 112.4 lb

## 2018-10-24 DIAGNOSIS — G2581 Restless legs syndrome: Secondary | ICD-10-CM

## 2018-10-24 MED ORDER — PRAMIPEXOLE DIHYDROCHLORIDE 0.25 MG PO TABS: ORAL_TABLET | ORAL | 3 refills | Status: AC

## 2018-10-24 NOTE — Patient Instructions (Signed)
Mirapex  3 tabs at bedtime. F/U in one year

## 2019-10-27 ENCOUNTER — Ambulatory Visit: Payer: 59 | Admitting: Neurology

## 2020-11-14 ENCOUNTER — Other Ambulatory Visit: Payer: Self-pay | Admitting: Obstetrics & Gynecology

## 2020-11-14 DIAGNOSIS — Z1231 Encounter for screening mammogram for malignant neoplasm of breast: Secondary | ICD-10-CM

## 2020-11-17 ENCOUNTER — Other Ambulatory Visit: Payer: Self-pay

## 2020-11-17 ENCOUNTER — Other Ambulatory Visit: Payer: Self-pay | Admitting: Family Medicine

## 2020-11-17 ENCOUNTER — Ambulatory Visit
Admission: RE | Admit: 2020-11-17 | Discharge: 2020-11-17 | Disposition: A | Discharge status: Home / Self Care | Payer: BC Managed Care – PPO | Source: Ambulatory Visit | Attending: Obstetrics & Gynecology | Admitting: Obstetrics & Gynecology

## 2020-11-17 DIAGNOSIS — Z1231 Encounter for screening mammogram for malignant neoplasm of breast: Secondary | ICD-10-CM

## 2020-11-18 ENCOUNTER — Other Ambulatory Visit: Payer: Self-pay | Admitting: Obstetrics and Gynecology

## 2020-11-18 DIAGNOSIS — N6310 Unspecified lump in the right breast, unspecified quadrant: Secondary | ICD-10-CM

## 2020-11-22 ENCOUNTER — Other Ambulatory Visit: Payer: Self-pay | Admitting: Obstetrics and Gynecology

## 2020-12-07 ENCOUNTER — Other Ambulatory Visit: Payer: BC Managed Care – PPO

## 2021-01-23 ENCOUNTER — Other Ambulatory Visit: Payer: Self-pay | Admitting: Family Medicine

## 2021-01-24 ENCOUNTER — Other Ambulatory Visit: Payer: Self-pay | Admitting: Obstetrics & Gynecology

## 2021-01-24 ENCOUNTER — Other Ambulatory Visit: Payer: Self-pay | Admitting: Family Medicine

## 2021-01-24 DIAGNOSIS — N6310 Unspecified lump in the right breast, unspecified quadrant: Secondary | ICD-10-CM

## 2021-01-26 ENCOUNTER — Ambulatory Visit: Payer: BC Managed Care – PPO

## 2021-01-26 ENCOUNTER — Other Ambulatory Visit: Payer: Self-pay

## 2021-01-26 ENCOUNTER — Ambulatory Visit
Admission: RE | Admit: 2021-01-26 | Discharge: 2021-01-26 | Disposition: A | Discharge status: Home / Self Care | Payer: BC Managed Care – PPO | Source: Ambulatory Visit | Attending: Obstetrics and Gynecology | Admitting: Obstetrics and Gynecology

## 2021-01-26 DIAGNOSIS — N6310 Unspecified lump in the right breast, unspecified quadrant: Secondary | ICD-10-CM

## 2021-07-26 ENCOUNTER — Other Ambulatory Visit: Payer: Self-pay

## 2021-07-26 ENCOUNTER — Ambulatory Visit (INDEPENDENT_AMBULATORY_CARE_PROVIDER_SITE_OTHER): Payer: BC Managed Care – PPO | Admitting: Obstetrics & Gynecology

## 2021-07-26 ENCOUNTER — Encounter: Payer: Self-pay | Admitting: Obstetrics & Gynecology

## 2021-07-26 VITALS — BP 114/68 | HR 58 | Ht 62.0 in | Wt 116.8 lb

## 2021-07-26 DIAGNOSIS — Z01419 Encounter for gynecological examination (general) (routine) without abnormal findings: Secondary | ICD-10-CM

## 2021-07-26 NOTE — Progress Notes (Signed)
   WELL-WOMAN EXAMINATION Patient name: Alice Robertson MRN [id number]  Date of birth: 03/21/1970 Chief Complaint:   Annual Exam  History of Present Illness:   Alice Robertson is a 51 y.o. G22P1001 female being seen today for a routine well-woman exam.   She has started to note some hot flashes/night sweats especially around her menses.  Not tried any OTC supplements.  Patient's last menstrual period was 07/13/2021 (approximate). She has noted that they are slightly heavier, but very tolerable.  OVerall doing well with Paragard and reports no acute complaints. The current method of family planning is IUD.   Prior [facility] patient- records obtained Last pap 05/29/2019- Pap/HPV neg Last mam 12/2020- Cat. I Last seen 05/2020- Paragard replaced 7/19//2019.    She has now transitioned to full time sleep medicine and really enjoying it! Recently returned from a conference in [place]  Depression screen [facility] 2/9 07/26/2021  Decreased Interest 0  Down, Depressed, Hopeless 0  PHQ - 2 Score 0  Altered sleeping 1  Tired, decreased energy 0  Change in appetite 0  Feeling bad or failure about yourself  0  Trouble concentrating 0  Moving slowly or fidgety/restless 0  Suicidal thoughts 0  PHQ-9 Score 1      Review of Systems:   Pertinent items are noted in HPI Denies any headaches, blurred vision, fatigue, shortness of breath, chest pain, abdominal pain, bowel movements, urination, or intercourse unless otherwise stated above.  Pertinent History Reviewed:  Reviewed past medical,surgical, social and family history.  Reviewed problem list, medications and allergies. Physical Assessment:   Vitals:   07/26/21 0845  BP: 114/68  Pulse: (!) 58  Weight: 116 lb 12.8 oz (53 kg)  Height: 5\' 2"  (1.575 m)  Body mass index is 21.36 kg/m.        Physical Examination:   General appearance - well appearing, and in no distress  Mental status - alert, oriented to person, place, and time  Psych:  She has a  normal mood and affect  Skin - warm and dry, normal color, no suspicious lesions noted  Chest - effort normal, all lung fields clear to auscultation bilaterally  Heart - normal rate and regular rhythm  Neck:  midline trachea, no thyromegaly or nodules  Breasts - breasts appear normal, no suspicious masses, no skin or nipple changes or  axillary nodes  Abdomen - soft, nontender, nondistended, no masses or organomegaly  Pelvic - VULVA: normal appearing vulva with no masses, tenderness or lesions  VAGINA: normal appearing vagina with normal color and discharge, no lesions  CERVIX: normal appearing cervix without discharge or lesions, no CMT, strings visualized at os  UTERUS: uterus is felt to be normal size, shape, consistency and nontender   ADNEXA: No adnexal masses or tenderness noted.  Extremities:  No swelling or varicosities noted  Chaperone:  declined      Assessment & Plan:  1) Well-Woman Exam -Pap and mammogram up to date  2) Contraception- IUD in proper location  3) Vasomotor symptoms/peri-menopause- reviewed conservative treatment/herbal supplements  Meds: No orders of the defined types were placed in this encounter.   Follow-up: Return in about 1 year (around 07/26/2022) for Annual, with Dr. 07/28/2022.   Charlotta Newton, DO Attending Obstetrician & Gynecologist, [facility] for [facility]

## 2022-06-30 IMAGING — MG DIGITAL DIAGNOSTIC BILAT W/ TOMO W/ CAD
8 series · 9 of 24 positions shown · non-contrast
Comparison: 04/19/2017 and 04/12/2017.

CLINICAL DATA: No current breast complaints. Patient had a
thermogram last year with some question of an abnormality. The
report from this study simply recommends follow-up thermography in a
year no reported abnormality.

EXAM:
DIGITAL DIAGNOSTIC BILATERAL MAMMOGRAM WITH TOMO AND CAD
TECHNIQUE: Bilateral digital diagnostic mammography and breast tomosynthesis
was performed. Digital images of the breasts were evaluated with
computer-aided detection.

[L CC synth-2D]
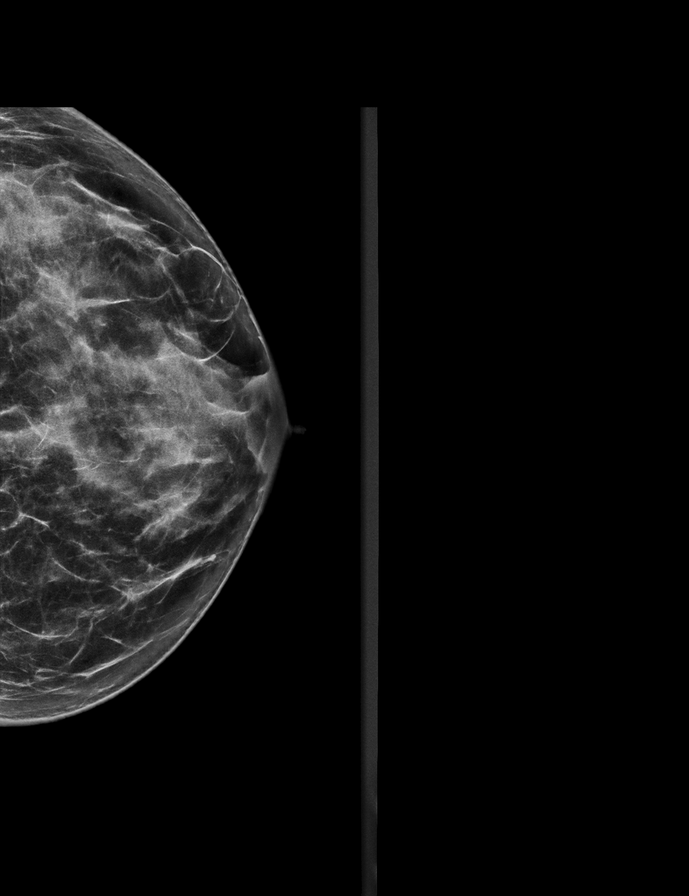

[L MLO synth-2D]
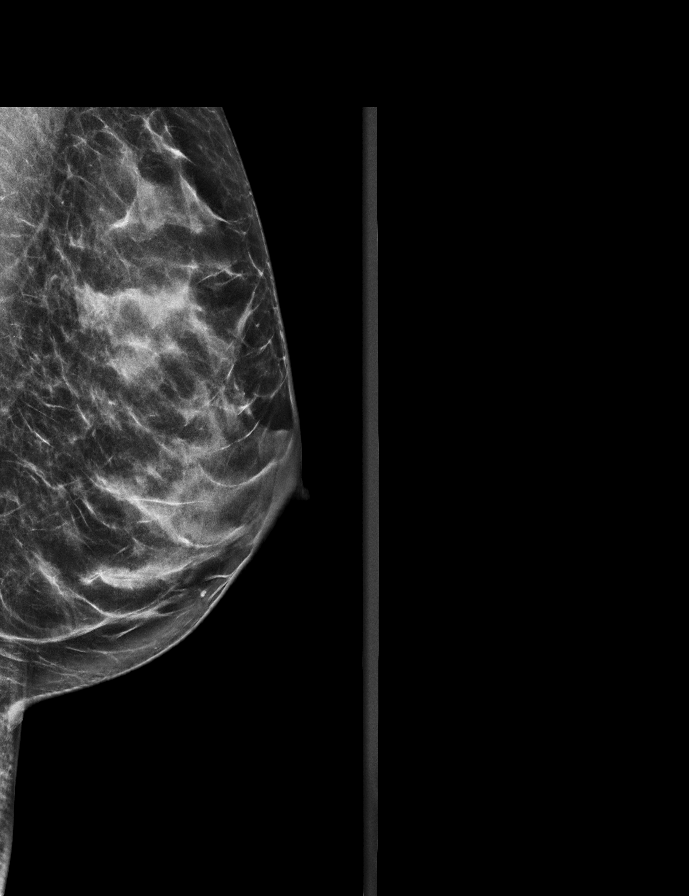

[R MLO synth-2D]
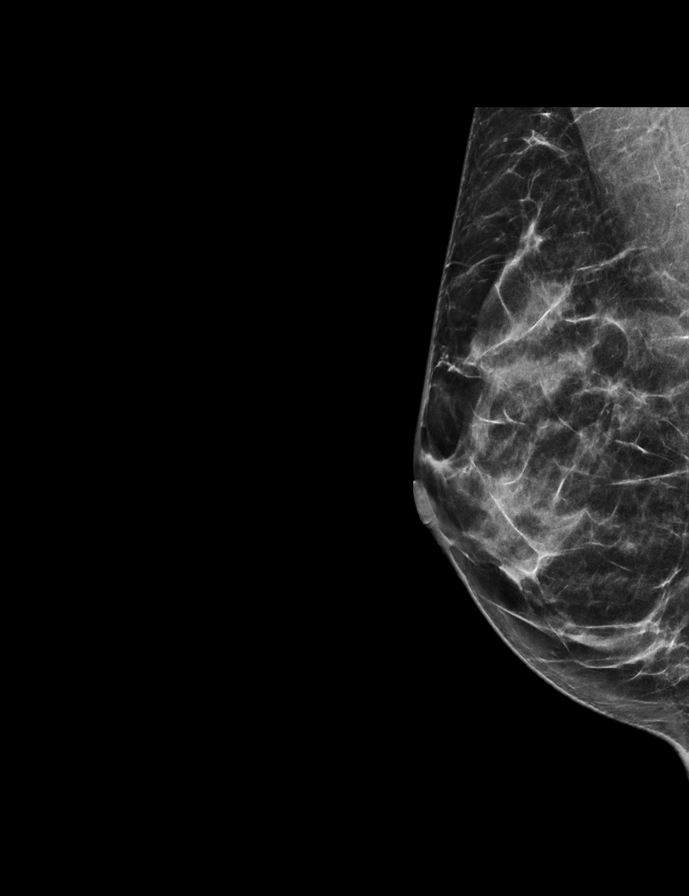

[R CC synth-2D]
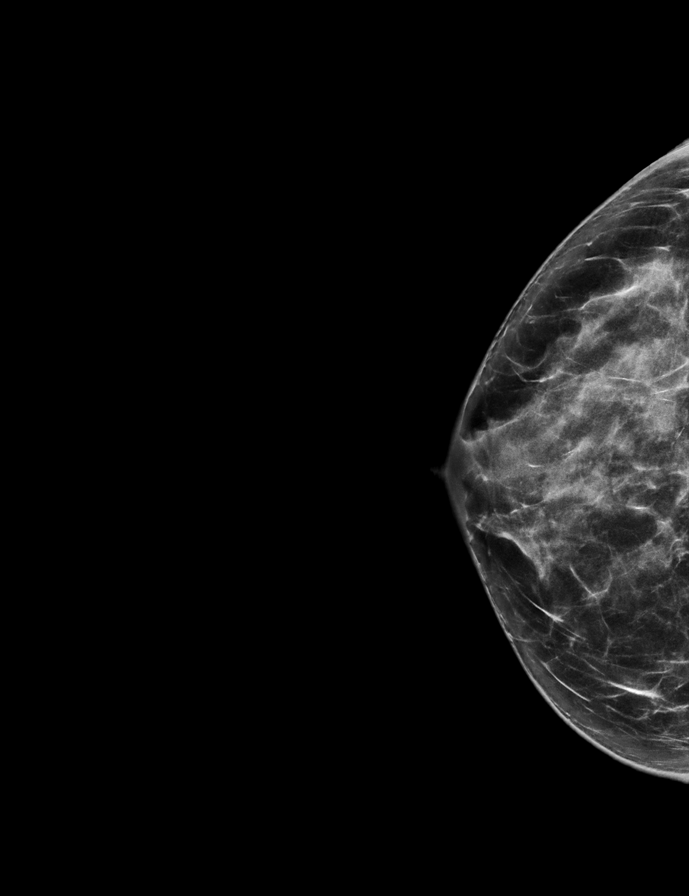

[L MLO tomo · 2 of 62 frames shown]
[frame 21/62]
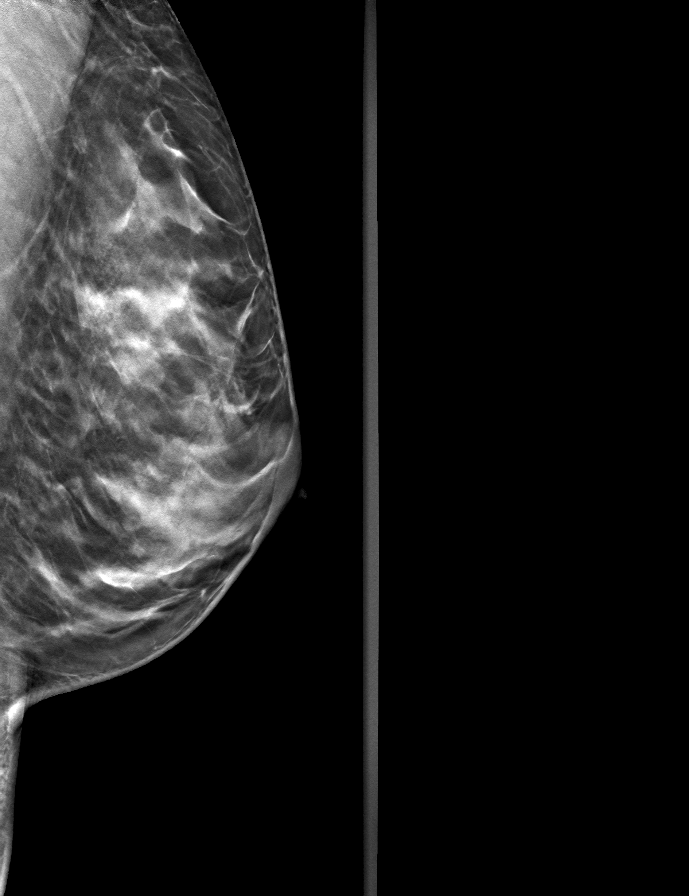
[frame 31/62]
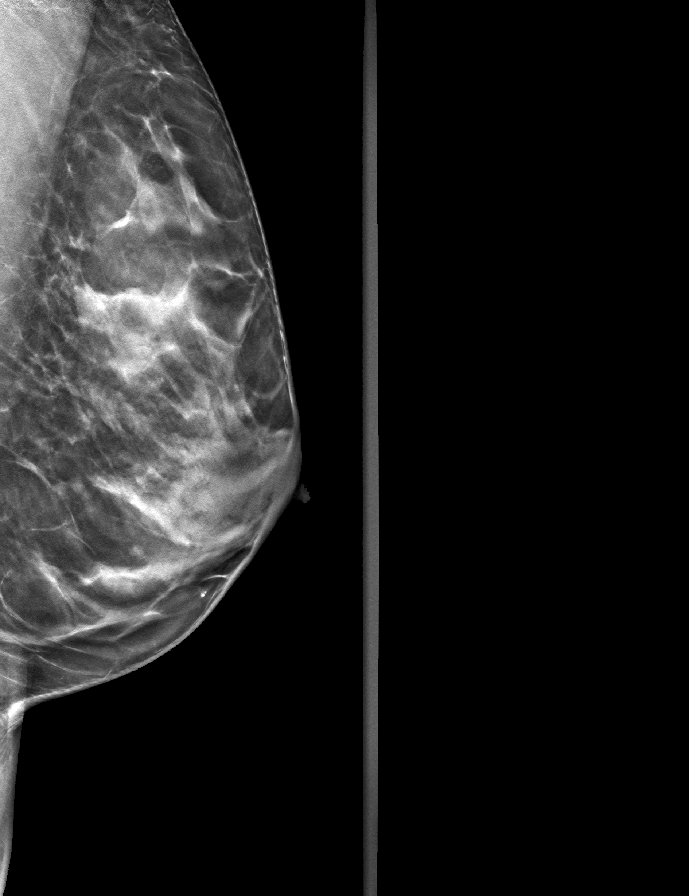

[R MLO tomo · tomo slice 31/60.0]
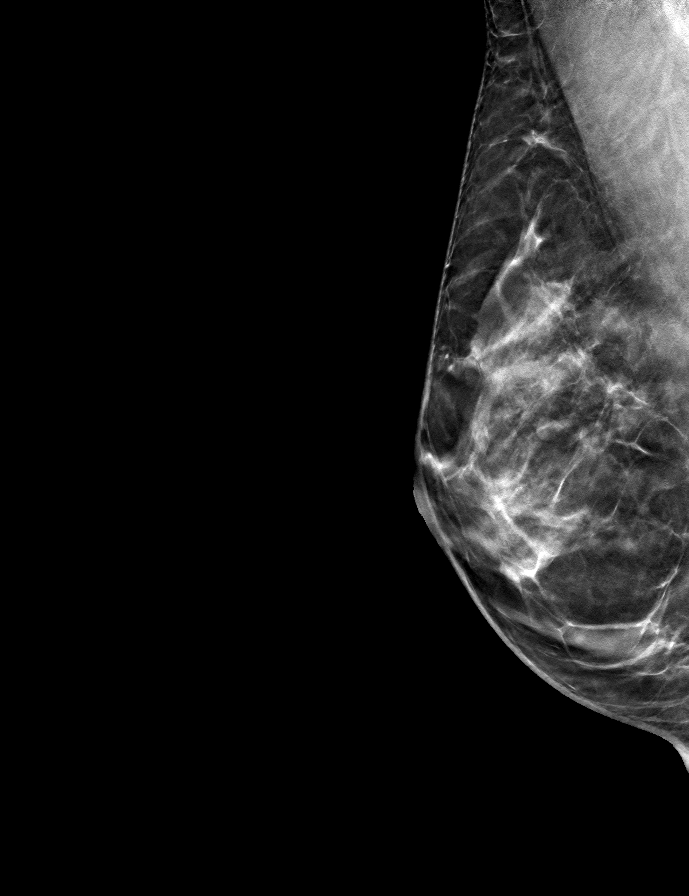

[R CC tomo · tomo slice 31/62.0]
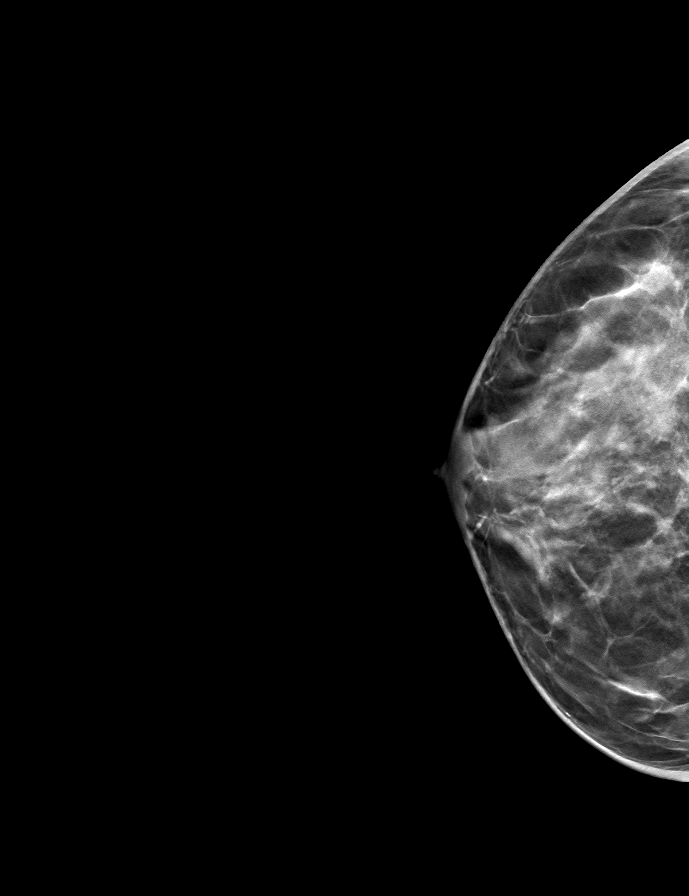

[L CC tomo · tomo slice 32/63.0]
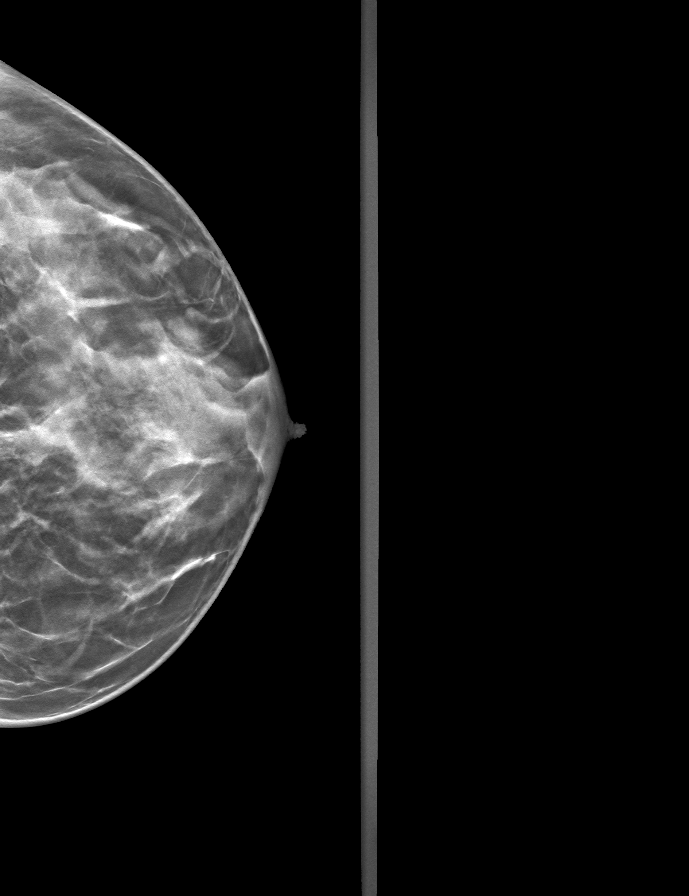

[9 of 24 positions shown; findings below may reference images not displayed]

ACR Breast Density Category c: The breast tissue is heterogeneously
dense, which may obscure small masses.
FINDINGS: There are no masses, areas of architectural distortion, areas of
significant asymmetry or suspicious calcifications.
IMPRESSION: No evidence of breast malignancy.

RECOMMENDATION:
Screening mammogram in one year.(Code:6H-G-B8Q)

I have discussed the findings and recommendations with the patient.
If applicable, a reminder letter will be sent to the patient
regarding the next appointment.

BI-RADS CATEGORY  1: Negative.

## 2022-07-25 DIAGNOSIS — N040 Nephrotic syndrome with minor glomerular abnormality: Secondary | ICD-10-CM | POA: Diagnosis not present

## 2022-07-25 DIAGNOSIS — E039 Hypothyroidism, unspecified: Secondary | ICD-10-CM | POA: Diagnosis not present

## 2022-07-25 DIAGNOSIS — E559 Vitamin D deficiency, unspecified: Secondary | ICD-10-CM | POA: Diagnosis not present

## 2022-08-10 ENCOUNTER — Ambulatory Visit: Payer: BC Managed Care – PPO | Admitting: Obstetrics & Gynecology

## 2022-09-05 DIAGNOSIS — H16143 Punctate keratitis, bilateral: Secondary | ICD-10-CM | POA: Diagnosis not present

## 2022-09-11 ENCOUNTER — Other Ambulatory Visit: Payer: Self-pay | Admitting: Family Medicine

## 2022-09-11 DIAGNOSIS — Z1231 Encounter for screening mammogram for malignant neoplasm of breast: Secondary | ICD-10-CM

## 2022-09-14 ENCOUNTER — Ambulatory Visit (INDEPENDENT_AMBULATORY_CARE_PROVIDER_SITE_OTHER): Payer: BC Managed Care – PPO | Admitting: Obstetrics & Gynecology

## 2022-09-14 ENCOUNTER — Encounter: Payer: Self-pay | Admitting: Obstetrics & Gynecology

## 2022-09-14 ENCOUNTER — Other Ambulatory Visit (HOSPITAL_COMMUNITY)
Admission: RE | Admit: 2022-09-14 | Discharge: 2022-09-14 | Disposition: A | Discharge status: Home / Self Care | Payer: BC Managed Care – PPO | Source: Ambulatory Visit | Attending: Obstetrics & Gynecology | Admitting: Obstetrics & Gynecology

## 2022-09-14 VITALS — BP 115/68 | HR 65 | Ht 62.0 in | Wt 116.2 lb

## 2022-09-14 DIAGNOSIS — Z01419 Encounter for gynecological examination (general) (routine) without abnormal findings: Secondary | ICD-10-CM | POA: Insufficient documentation

## 2022-09-14 DIAGNOSIS — Z1211 Encounter for screening for malignant neoplasm of colon: Secondary | ICD-10-CM | POA: Diagnosis not present

## 2022-09-14 DIAGNOSIS — Z975 Presence of (intrauterine) contraceptive device: Secondary | ICD-10-CM | POA: Diagnosis not present

## 2022-09-14 NOTE — Progress Notes (Signed)
   WELL-WOMAN EXAMINATION Patient name: Alice Robertson MRN [id number]  Date of birth: 26-Mar-1970 Chief Complaint:   Gynecologic Exam  History of Present Illness:   Alice Robertson is a 52 y.o. G1P1 female being seen today for a routine well-woman exam.  Today she notes no acute complaints or concerns  Recent thermogram was abnormal- scheduled for mammogram in october  No LMP recorded. (Menstrual status: IUD). Denies issues with her menses, cycles q 21 days.  Typically menses about 5 days.  The current method of family planning is IUD- Paragard- placed 06/2018   Last pap 05/2019.  Last mammogram: scheduled for october. Last colonoscopy: discussed today, plan for cologuard     09/14/2022    9:34 AM 07/26/2021    8:44 AM  Depression screen PHQ 2/9  Decreased Interest 0 0  Down, Depressed, Hopeless 0 0  PHQ - 2 Score 0 0  Altered sleeping 0 1  Tired, decreased energy 0 0  Change in appetite 1 0  Feeling bad or failure about yourself  0 0  Trouble concentrating 0 0  Moving slowly or fidgety/restless 0 0  Suicidal thoughts 0 0  PHQ-9 Score 1 1      Review of Systems:   Pertinent items are noted in HPI Denies any headaches, blurred vision, fatigue, shortness of breath, chest pain, abdominal pain, bowel movements, urination, or intercourse unless otherwise stated above.  Pertinent History Reviewed:  Reviewed past medical,surgical, social and family history.  Reviewed problem list, medications and allergies. Physical Assessment:   Vitals:   09/14/22 0932  BP: 115/68  Pulse: 65  Weight: 116 lb 3.2 oz (52.7 kg)  Height: 5\' 2"  (1.575 m)  Body mass index is 21.25 kg/m.        Physical Examination:   General appearance - well appearing, and in no distress  Mental status - alert, oriented to person, place, and time  Psych:  She has a normal mood and affect  Skin - warm and dry, normal color, no suspicious lesions noted  Chest - effort normal, all lung fields clear to  auscultation bilaterally  Heart - normal rate and regular rhythm  Neck:  midline trachea, no thyromegaly or nodules  Breasts - breasts appear normal, no suspicious masses, no skin or nipple changes or  axillary nodes  Abdomen - soft, nontender, nondistended, no masses or organomegaly  Pelvic - VULVA: normal appearing vulva with no masses, tenderness or lesions  VAGINA: normal appearing vagina with normal color and discharge, no lesions  CERVIX: normal appearing cervix without discharge or lesions, no CMT, strings visualized at os  Thin prep pap is done with HR HPV cotesting  UTERUS: uterus is felt to be normal size, shape, consistency and nontender   ADNEXA: No adnexal masses or tenderness noted.  Extremities:  No swelling or varicosities noted  Chaperone:  pt declined      Assessment & Plan:  1) Well-Woman Exam -pap collected, reviewed screening guidelines -mammogram already scheduled   2) IUD in place  Orders Placed This Encounter  Procedures   Cologuard    Meds: No orders of the defined types were placed in this encounter.   Follow-up: Return in about 1 year (around 09/15/2023) for Annual.   09/17/2023, DO Attending Obstetrician & Gynecologist, [facility] for [facility], [facility]

## 2022-09-20 LAB — CYTOLOGY - PAP
Comment: NEGATIVE
Diagnosis: NEGATIVE
Diagnosis: REACTIVE
High risk HPV: NEGATIVE

## 2022-09-24 DIAGNOSIS — Z1211 Encounter for screening for malignant neoplasm of colon: Secondary | ICD-10-CM | POA: Diagnosis not present

## 2022-10-01 ENCOUNTER — Ambulatory Visit: Payer: BC Managed Care – PPO

## 2022-10-04 LAB — COLOGUARD: COLOGUARD: NEGATIVE

## 2023-01-08 ENCOUNTER — Ambulatory Visit
Admission: RE | Admit: 2023-01-08 | Discharge: 2023-01-08 | Disposition: A | Discharge status: Home / Self Care | Payer: BC Managed Care – PPO | Source: Ambulatory Visit | Attending: Family Medicine | Admitting: Family Medicine

## 2023-01-08 DIAGNOSIS — Z1231 Encounter for screening mammogram for malignant neoplasm of breast: Secondary | ICD-10-CM

## 2023-02-06 DIAGNOSIS — N040 Nephrotic syndrome with minor glomerular abnormality: Secondary | ICD-10-CM | POA: Diagnosis not present

## 2023-02-13 DIAGNOSIS — D649 Anemia, unspecified: Secondary | ICD-10-CM | POA: Diagnosis not present

## 2023-02-13 DIAGNOSIS — N040 Nephrotic syndrome with minor glomerular abnormality: Secondary | ICD-10-CM | POA: Diagnosis not present

## 2023-02-13 DIAGNOSIS — E559 Vitamin D deficiency, unspecified: Secondary | ICD-10-CM | POA: Diagnosis not present

## 2023-02-13 DIAGNOSIS — E039 Hypothyroidism, unspecified: Secondary | ICD-10-CM | POA: Diagnosis not present

## 2023-03-15 DIAGNOSIS — N049 Nephrotic syndrome with unspecified morphologic changes: Secondary | ICD-10-CM | POA: Diagnosis not present

## 2023-04-04 DIAGNOSIS — E039 Hypothyroidism, unspecified: Secondary | ICD-10-CM | POA: Diagnosis not present

## 2023-04-04 DIAGNOSIS — G2581 Restless legs syndrome: Secondary | ICD-10-CM | POA: Diagnosis not present

## 2023-04-04 DIAGNOSIS — N040 Nephrotic syndrome with minor glomerular abnormality: Secondary | ICD-10-CM | POA: Diagnosis not present

## 2023-04-19 DIAGNOSIS — D2261 Melanocytic nevi of right upper limb, including shoulder: Secondary | ICD-10-CM | POA: Diagnosis not present

## 2023-04-19 DIAGNOSIS — D225 Melanocytic nevi of trunk: Secondary | ICD-10-CM | POA: Diagnosis not present

## 2023-04-19 DIAGNOSIS — D2272 Melanocytic nevi of left lower limb, including hip: Secondary | ICD-10-CM | POA: Diagnosis not present

## 2023-04-19 DIAGNOSIS — D2262 Melanocytic nevi of left upper limb, including shoulder: Secondary | ICD-10-CM | POA: Diagnosis not present

## 2023-04-26 DIAGNOSIS — I788 Other diseases of capillaries: Secondary | ICD-10-CM | POA: Diagnosis not present

## 2023-05-01 DIAGNOSIS — R399 Unspecified symptoms and signs involving the genitourinary system: Secondary | ICD-10-CM | POA: Diagnosis not present

## 2023-05-20 DIAGNOSIS — N040 Nephrotic syndrome with minor glomerular abnormality: Secondary | ICD-10-CM | POA: Diagnosis not present

## 2023-05-29 DIAGNOSIS — E559 Vitamin D deficiency, unspecified: Secondary | ICD-10-CM | POA: Diagnosis not present

## 2023-05-29 DIAGNOSIS — N040 Nephrotic syndrome with minor glomerular abnormality: Secondary | ICD-10-CM | POA: Diagnosis not present

## 2023-05-29 DIAGNOSIS — G2581 Restless legs syndrome: Secondary | ICD-10-CM | POA: Diagnosis not present

## 2023-05-29 DIAGNOSIS — E039 Hypothyroidism, unspecified: Secondary | ICD-10-CM | POA: Diagnosis not present

## 2023-07-08 DIAGNOSIS — N926 Irregular menstruation, unspecified: Secondary | ICD-10-CM | POA: Diagnosis not present

## 2023-07-08 DIAGNOSIS — R5381 Other malaise: Secondary | ICD-10-CM | POA: Diagnosis not present

## 2023-07-08 DIAGNOSIS — E559 Vitamin D deficiency, unspecified: Secondary | ICD-10-CM | POA: Diagnosis not present

## 2023-07-08 DIAGNOSIS — B2790 Infectious mononucleosis, unspecified without complication: Secondary | ICD-10-CM | POA: Diagnosis not present

## 2023-07-08 DIAGNOSIS — E039 Hypothyroidism, unspecified: Secondary | ICD-10-CM | POA: Diagnosis not present

## 2023-07-08 DIAGNOSIS — D729 Disorder of white blood cells, unspecified: Secondary | ICD-10-CM | POA: Diagnosis not present

## 2023-07-08 DIAGNOSIS — B488 Other specified mycoses: Secondary | ICD-10-CM | POA: Diagnosis not present

## 2023-07-08 DIAGNOSIS — A6920 Lyme disease, unspecified: Secondary | ICD-10-CM | POA: Diagnosis not present

## 2023-07-24 DIAGNOSIS — R4189 Other symptoms and signs involving cognitive functions and awareness: Secondary | ICD-10-CM | POA: Diagnosis not present

## 2023-07-24 DIAGNOSIS — R5381 Other malaise: Secondary | ICD-10-CM | POA: Diagnosis not present

## 2023-07-24 DIAGNOSIS — B488 Other specified mycoses: Secondary | ICD-10-CM | POA: Diagnosis not present

## 2023-07-24 DIAGNOSIS — D729 Disorder of white blood cells, unspecified: Secondary | ICD-10-CM | POA: Diagnosis not present

## 2023-09-19 DIAGNOSIS — E559 Vitamin D deficiency, unspecified: Secondary | ICD-10-CM | POA: Diagnosis not present

## 2023-09-19 DIAGNOSIS — N040 Nephrotic syndrome with minor glomerular abnormality: Secondary | ICD-10-CM | POA: Diagnosis not present

## 2023-10-07 ENCOUNTER — Other Ambulatory Visit: Payer: Self-pay | Admitting: Nephrology

## 2023-10-07 DIAGNOSIS — E559 Vitamin D deficiency, unspecified: Secondary | ICD-10-CM

## 2023-10-17 ENCOUNTER — Ambulatory Visit: Payer: BC Managed Care – PPO | Admitting: Obstetrics & Gynecology

## 2023-11-15 ENCOUNTER — Ambulatory Visit (INDEPENDENT_AMBULATORY_CARE_PROVIDER_SITE_OTHER): Payer: BC Managed Care – PPO | Admitting: Obstetrics & Gynecology

## 2023-11-15 ENCOUNTER — Encounter: Payer: Self-pay | Admitting: Obstetrics & Gynecology

## 2023-11-15 VITALS — BP 122/73 | HR 66 | Ht 62.0 in | Wt 118.8 lb

## 2023-11-15 DIAGNOSIS — R61 Generalized hyperhidrosis: Secondary | ICD-10-CM | POA: Diagnosis not present

## 2023-11-15 DIAGNOSIS — Z01419 Encounter for gynecological examination (general) (routine) without abnormal findings: Secondary | ICD-10-CM

## 2023-11-15 DIAGNOSIS — Z975 Presence of (intrauterine) contraceptive device: Secondary | ICD-10-CM

## 2023-11-15 NOTE — Progress Notes (Signed)
   WELL-WOMAN EXAMINATION Patient name: Alice Robertson MRN [id number]  Date of birth: 11-03-1970 Chief Complaint:   Gynecologic Exam  History of Present Illness:   Alice Robertson is a 53 y.o. G1P0  female being seen today for a routine well-woman exam.   Paragard placed 06/2018 Patient notes that she is still having regular periods each month.  She denies heavy menstrual bleeding or dysmenorrhea.  Denies abnormal discharge, itching or irritation.  She has started to note some night sweats.  Denies hot flashes denies vaginal dryness.  She otherwise reports no acute complaints  Patient's last menstrual period was 11/02/2023.  The current method of family planning is IUD- Paragard   Last pap 08/2022.  Last mammogram: 12/2022. Last colonoscopy: 08/2022     11/15/2023   11:06 AM 09/14/2022    9:34 AM 07/26/2021    8:44 AM  Depression screen PHQ 2/9  Decreased Interest 0 0 0  Down, Depressed, Hopeless 0 0 0  PHQ - 2 Score 0 0 0  Altered sleeping 0 0 1  Tired, decreased energy 0 0 0  Change in appetite 0 1 0  Feeling bad or failure about yourself  0 0 0  Trouble concentrating 0 0 0  Moving slowly or fidgety/restless 0 0 0  Suicidal thoughts 0 0 0  PHQ-9 Score 0 1 1      Review of Systems:   Pertinent items are noted in HPI Denies any headaches, blurred vision, fatigue, shortness of breath, chest pain, abdominal pain, bowel movements, urination, or intercourse unless otherwise stated above.  Pertinent History Reviewed:  Reviewed past medical,surgical, social and family history.  Reviewed problem list, medications and allergies. Physical Assessment:   Vitals:   11/15/23 1055  BP: 122/73  Pulse: 66  Weight: 118 lb 12.8 oz (53.9 kg)  Height: 5\' 2"  (1.575 m)  Body mass index is 21.73 kg/m.        Physical Examination:   General appearance - well appearing, and in no distress  Mental status - alert, oriented to person, place, and time  Psych:  She has a normal mood and  affect  Skin - warm and dry, normal color, no suspicious lesions noted  Chest - effort normal, all lung fields clear to auscultation bilaterally  Heart - normal rate and regular rhythm  Neck:  midline trachea, no thyromegaly or nodules  Breasts - breasts appear normal, no suspicious masses, no skin or nipple changes or  axillary nodes  Abdomen - soft, nontender, nondistended, no masses or organomegaly  Pelvic - VULVA: normal appearing vulva with no masses, tenderness or lesions  VAGINA: normal appearing vagina with normal color and discharge, no lesions  CERVIX: normal appearing cervix without discharge or lesions, no CMT, strings visualized  UTERUS: uterus is felt to be normal size, shape, consistency and nontender   ADNEXA: No adnexal masses or tenderness noted.  Extremities:  No swelling or varicosities noted  Chaperone:  pt declined      Assessment & Plan:  1) Well-Woman Exam -Screening exams up-to-date  2) Night sweats -Reviewed conservative hormonal options  3) IUD in place  No orders of the defined types were placed in this encounter.   Meds: No orders of the defined types were placed in this encounter.   Follow-up: Return in about 1 year (around 11/14/2024) for Annual.   Alice Hidalgo, DO Attending Obstetrician & Gynecologist, [facility] for [facility], [facility]

## 2023-12-19 DIAGNOSIS — N040 Nephrotic syndrome with minor glomerular abnormality: Secondary | ICD-10-CM | POA: Diagnosis not present

## 2023-12-19 DIAGNOSIS — G2581 Restless legs syndrome: Secondary | ICD-10-CM | POA: Diagnosis not present

## 2023-12-19 DIAGNOSIS — D649 Anemia, unspecified: Secondary | ICD-10-CM | POA: Diagnosis not present

## 2023-12-31 DIAGNOSIS — N040 Nephrotic syndrome with minor glomerular abnormality: Secondary | ICD-10-CM | POA: Diagnosis not present

## 2024-01-08 DIAGNOSIS — H3561 Retinal hemorrhage, right eye: Secondary | ICD-10-CM | POA: Diagnosis not present

## 2024-03-30 DIAGNOSIS — N040 Nephrotic syndrome with minor glomerular abnormality: Secondary | ICD-10-CM | POA: Diagnosis not present

## 2024-05-01 DIAGNOSIS — E559 Vitamin D deficiency, unspecified: Secondary | ICD-10-CM | POA: Diagnosis not present

## 2024-05-01 DIAGNOSIS — N040 Nephrotic syndrome with minor glomerular abnormality: Secondary | ICD-10-CM | POA: Diagnosis not present

## 2024-05-01 DIAGNOSIS — N393 Stress incontinence (female) (male): Secondary | ICD-10-CM | POA: Diagnosis not present

## 2024-05-01 DIAGNOSIS — D649 Anemia, unspecified: Secondary | ICD-10-CM | POA: Diagnosis not present

## 2024-05-07 ENCOUNTER — Ambulatory Visit
Admission: RE | Admit: 2024-05-07 | Discharge: 2024-05-07 | Disposition: A | Discharge status: Home / Self Care | Payer: BC Managed Care – PPO | Source: Ambulatory Visit | Attending: Nephrology | Admitting: Nephrology

## 2024-05-07 DIAGNOSIS — M8588 Other specified disorders of bone density and structure, other site: Secondary | ICD-10-CM | POA: Diagnosis not present

## 2024-05-07 DIAGNOSIS — E559 Vitamin D deficiency, unspecified: Secondary | ICD-10-CM

## 2024-05-07 DIAGNOSIS — N958 Other specified menopausal and perimenopausal disorders: Secondary | ICD-10-CM | POA: Diagnosis not present

## 2024-06-03 ENCOUNTER — Ambulatory Visit: Admitting: Physician Assistant

## 2024-06-03 ENCOUNTER — Encounter: Payer: Self-pay | Admitting: Physician Assistant

## 2024-06-03 VITALS — BP 111/66

## 2024-06-03 DIAGNOSIS — D1801 Hemangioma of skin and subcutaneous tissue: Secondary | ICD-10-CM

## 2024-06-03 DIAGNOSIS — D229 Melanocytic nevi, unspecified: Secondary | ICD-10-CM

## 2024-06-03 DIAGNOSIS — L821 Other seborrheic keratosis: Secondary | ICD-10-CM | POA: Diagnosis not present

## 2024-06-03 DIAGNOSIS — D2372 Other benign neoplasm of skin of left lower limb, including hip: Secondary | ICD-10-CM

## 2024-06-03 DIAGNOSIS — L814 Other melanin hyperpigmentation: Secondary | ICD-10-CM

## 2024-06-03 DIAGNOSIS — Z1283 Encounter for screening for malignant neoplasm of skin: Secondary | ICD-10-CM

## 2024-06-03 DIAGNOSIS — L578 Other skin changes due to chronic exposure to nonionizing radiation: Secondary | ICD-10-CM

## 2024-06-03 DIAGNOSIS — D239 Other benign neoplasm of skin, unspecified: Secondary | ICD-10-CM

## 2024-06-03 DIAGNOSIS — W908XXA Exposure to other nonionizing radiation, initial encounter: Secondary | ICD-10-CM

## 2024-06-03 NOTE — Patient Instructions (Signed)

## 2024-06-03 NOTE — Progress Notes (Signed)
   New Patient Visit   Subjective  Alice Robertson is a 54 y.o. female who presents for the following: Skin Cancer Screening and Full Body Skin Exam - No history of skin cancer. No family history of Melanoma  The patient presents for Total-Body Skin Exam (TBSE) for skin cancer screening and mole check. The patient has spots, moles and lesions to be evaluated, some may be new or changing and the patient may have concern these could be cancer.    The following portions of the chart were reviewed this encounter and updated as appropriate: medications, allergies, medical history  Review of Systems:  No other skin or systemic complaints except as noted in HPI or Assessment and Plan.  Objective  Well appearing patient in no apparent distress; mood and affect are within normal limits.  A full examination was performed including scalp, head, eyes, ears, nose, lips, neck, chest, axillae, abdomen, back, buttocks, bilateral upper extremities, bilateral lower extremities, hands, feet, fingers, toes, fingernails, and toenails. All findings within normal limits unless otherwise noted below.   Relevant physical exam findings are noted in the Assessment and Plan.    Assessment & Plan   SKIN CANCER SCREENING PERFORMED TODAY.  ACTINIC DAMAGE - Chronic condition, secondary to cumulative UV/sun exposure - diffuse scaly erythematous macules with underlying dyspigmentation - Recommend daily broad spectrum sunscreen SPF 30+ to sun-exposed areas, reapply every 2 hours as needed.  - Staying in the shade or wearing long sleeves, sun glasses (UVA+UVB protection) and wide brim hats (4-inch brim around the entire circumference of the hat) are also recommended for sun protection.  - Call for new or changing lesions.  LENTIGINES, SEBORRHEIC KERATOSES, CHERRY ANGIOMAS - Benign normal skin lesions - Benign-appearing - Call for any changes  MELANOCYTIC NEVI - Tan-brown and/or pink-flesh-colored symmetric  macules and papules - Benign appearing on exam today - Observation - Call clinic for new or changing moles - Recommend daily use of broad spectrum spf 30+ sunscreen to sun-exposed areas.   DERMATOFIBROMA Exam: Firm pink/brown papulenodule with dimple sign of left popliteal and left lat lower leg.  Treatment Plan: A dermatofibroma is a benign growth possibly related to trauma, such as an insect bite, cut from shaving, or inflamed acne-type bump.  Treatment options to remove include shave or excision with resulting scar and risk of recurrence.  Since benign-appearing and not bothersome, will observe for now.     SCREENING EXAM FOR SKIN CANCER   SEBORRHEIC KERATOSIS   MULTIPLE BENIGN NEVI   LENTIGINES   DERMATOFIBROMA   ACTINIC SKIN DAMAGE   CHERRY ANGIOMA    Return in about 1 year (around 06/03/2025) for TBSE.  I, Eliot Guernsey, CMA, am acting as scribe for Shashwat Cleary K, PA-C .   Documentation: I have reviewed the above documentation for accuracy and completeness, and I agree with the above.  Lorrie Strauch K, PA-C

## 2024-06-19 DIAGNOSIS — R6882 Decreased libido: Secondary | ICD-10-CM | POA: Diagnosis not present

## 2024-06-19 DIAGNOSIS — N926 Irregular menstruation, unspecified: Secondary | ICD-10-CM | POA: Diagnosis not present

## 2024-06-19 DIAGNOSIS — R7303 Prediabetes: Secondary | ICD-10-CM | POA: Diagnosis not present

## 2024-06-19 DIAGNOSIS — G2581 Restless legs syndrome: Secondary | ICD-10-CM | POA: Diagnosis not present

## 2024-06-19 DIAGNOSIS — E039 Hypothyroidism, unspecified: Secondary | ICD-10-CM | POA: Diagnosis not present

## 2024-06-19 DIAGNOSIS — R5381 Other malaise: Secondary | ICD-10-CM | POA: Diagnosis not present

## 2024-06-19 DIAGNOSIS — E559 Vitamin D deficiency, unspecified: Secondary | ICD-10-CM | POA: Diagnosis not present

## 2024-07-09 DIAGNOSIS — L2089 Other atopic dermatitis: Secondary | ICD-10-CM | POA: Diagnosis not present

## 2024-11-06 DIAGNOSIS — E039 Hypothyroidism, unspecified: Secondary | ICD-10-CM | POA: Diagnosis not present

## 2024-11-06 DIAGNOSIS — E559 Vitamin D deficiency, unspecified: Secondary | ICD-10-CM | POA: Diagnosis not present

## 2024-11-06 DIAGNOSIS — N959 Unspecified menopausal and perimenopausal disorder: Secondary | ICD-10-CM | POA: Diagnosis not present

## 2024-11-06 DIAGNOSIS — D649 Anemia, unspecified: Secondary | ICD-10-CM | POA: Diagnosis not present

## 2024-11-06 DIAGNOSIS — B2790 Infectious mononucleosis, unspecified without complication: Secondary | ICD-10-CM | POA: Diagnosis not present

## 2024-11-18 DIAGNOSIS — B2790 Infectious mononucleosis, unspecified without complication: Secondary | ICD-10-CM | POA: Diagnosis not present

## 2024-11-18 DIAGNOSIS — G2581 Restless legs syndrome: Secondary | ICD-10-CM | POA: Diagnosis not present

## 2024-11-18 DIAGNOSIS — R5381 Other malaise: Secondary | ICD-10-CM | POA: Diagnosis not present

## 2024-11-18 DIAGNOSIS — R7303 Prediabetes: Secondary | ICD-10-CM | POA: Diagnosis not present

## 2024-11-19 ENCOUNTER — Ambulatory Visit (INDEPENDENT_AMBULATORY_CARE_PROVIDER_SITE_OTHER): Admitting: Obstetrics & Gynecology

## 2024-11-19 ENCOUNTER — Encounter: Payer: Self-pay | Admitting: Obstetrics & Gynecology

## 2024-11-19 VITALS — BP 123/72 | HR 101 | Ht 62.0 in | Wt 122.0 lb

## 2024-11-19 DIAGNOSIS — N393 Stress incontinence (female) (male): Secondary | ICD-10-CM | POA: Diagnosis not present

## 2024-11-19 DIAGNOSIS — Z01419 Encounter for gynecological examination (general) (routine) without abnormal findings: Secondary | ICD-10-CM

## 2024-11-19 DIAGNOSIS — N941 Unspecified dyspareunia: Secondary | ICD-10-CM | POA: Diagnosis not present

## 2024-11-19 DIAGNOSIS — E559 Vitamin D deficiency, unspecified: Secondary | ICD-10-CM | POA: Diagnosis not present

## 2024-11-19 DIAGNOSIS — Z975 Presence of (intrauterine) contraceptive device: Secondary | ICD-10-CM | POA: Diagnosis not present

## 2024-11-19 DIAGNOSIS — N040 Nephrotic syndrome with minor glomerular abnormality: Secondary | ICD-10-CM | POA: Diagnosis not present

## 2024-11-19 DIAGNOSIS — Z1331 Encounter for screening for depression: Secondary | ICD-10-CM

## 2024-11-19 DIAGNOSIS — E039 Hypothyroidism, unspecified: Secondary | ICD-10-CM | POA: Diagnosis not present

## 2024-11-19 NOTE — Progress Notes (Signed)
 WELL-WOMAN EXAMINATION Patient name: Alice Robertson MRN [id number]  Date of birth: 01/27/1970 Chief Complaint:   Gynecologic Exam  History of Present Illness:   DYASIA Robertson is a 54 y.o. G1P1 female being seen today for a routine well-woman exam.   Menses are still every month, denies HMB or dysmenorrhea.  On occasion may note occasional dyspareunia, seems spontaneous, not all the time.  Denies pelvic pain other times or abdominal bloating.  Patient also followed at [facility], brought lab work.  Estrogen elevated low testosterone  and progesterone  and has been started on compounding testosterone  and progesterone  for months  Paragard 2019  No LMP recorded. (Menstrual status: IUD). Denies issues with her menses The current method of family planning is ParaGard.    Last pap 08/2022.  Last mammogram: 12/2022. Last colonoscopy: 08/2022     11/19/2024    2:15 PM 11/15/2023   11:06 AM 09/14/2022    9:34 AM 07/26/2021    8:44 AM  Depression screen PHQ 2/9  Decreased Interest 0 0 0 0  Down, Depressed, Hopeless 0 0 0 0  PHQ - 2 Score 0 0 0 0  Altered sleeping 0 0 0 1  Tired, decreased energy 0 0 0 0  Change in appetite 0 0 1 0  Feeling bad or failure about yourself  0 0 0 0  Trouble concentrating 0 0 0 0  Moving slowly or fidgety/restless 0 0 0 0  Suicidal thoughts 0 0 0 0  PHQ-9 Score 0 0  1  1      Data saved with a previous flowsheet row definition      Review of Systems:   Pertinent items are noted in HPI Denies any headaches, blurred vision, fatigue, shortness of breath, chest pain, abdominal pain, bowel movements, urination, or intercourse unless otherwise stated above.  Pertinent History Reviewed:  Reviewed past medical,surgical, social and family history.  Reviewed problem list, medications and allergies. Physical Assessment:   Vitals:   11/19/24 1416  BP: 123/72  Pulse: (!) 101  Weight: 122 lb (55.3 kg)  Height: 5' 2 (1.575 m)  Body mass index is 22.31  kg/m.        Physical Examination:   General appearance - well appearing, and in no distress  Mental status - alert, oriented to person, place, and time  Psych:  She has a normal mood and affect  Skin - warm and dry, normal color, no suspicious lesions noted  Chest - effort normal, all lung fields clear to auscultation bilaterally  Heart - normal rate and regular rhythm  Neck:  midline trachea, no thyromegaly or nodules  Breasts - breasts appear normal, no suspicious masses, no skin or nipple changes or  axillary nodes  Abdomen - soft, nontender, nondistended, no masses or organomegaly  Pelvic - VULVA: normal appearing vulva with no masses, tenderness or lesions  VAGINA: normal appearing vagina with normal color and discharge, no lesions  CERVIX: normal appearing cervix without discharge or lesions, no CMT, strings noted at os.    UTERUS: uterus is felt to be normal size, shape, consistency and nontender   ADNEXA: No adnexal masses or tenderness noted.  Some right sided fullness noted  Extremities:  No swelling or varicosities noted  Chaperone: pt declined     Assessment & Plan:  1) Well-Woman Exam -Pap up-to-date, reviewed screening guidelines - Will plan for mammogram next year, []  or may do QT imaging at [facility]  2) Paragard in place  3)  Dyspareunia/pelvic pain []  Plan for pelvic ultrasound to rule out underlying etiology  Orders Placed This Encounter  Procedures   US  PELVIC COMPLETE WITH TRANSVAGINAL    Meds: No orders of the defined types were placed in this encounter.   Follow-up: Return for pelvic US , 80yr annual.   Shoshana Johal, DO Attending Obstetrician & Gynecologist, [facility] for [employer], [facility]

## 2025-01-07 ENCOUNTER — Ambulatory Visit

## 2025-01-07 DIAGNOSIS — N83202 Unspecified ovarian cyst, left side: Secondary | ICD-10-CM

## 2025-01-07 DIAGNOSIS — N941 Unspecified dyspareunia: Secondary | ICD-10-CM

## 2025-01-07 DIAGNOSIS — D252 Subserosal leiomyoma of uterus: Secondary | ICD-10-CM | POA: Diagnosis not present

## 2025-01-07 DIAGNOSIS — Z975 Presence of (intrauterine) contraceptive device: Secondary | ICD-10-CM | POA: Diagnosis not present

## 2025-01-07 DIAGNOSIS — D251 Intramural leiomyoma of uterus: Secondary | ICD-10-CM

## 2025-01-07 NOTE — Progress Notes (Signed)
 PELVIC US  TA/TV: heterogeneous anteverted uterus with multiple fibroids (#1) fundal subserosal fibroid 1.6 x 1.6 x 1.1 cm,(#2) anterior intramural fibroid 1.6 x 1.2 x 1.6 cm,(#3) fundal right intramural 2.6 x 2.3 x 1.5 cm,EEC 3.2 mm,IUD is centrally located within the endometrium,simple left unilocular ovarian cyst 2.9 x 1.9 x 2.6 cm,normal right ovary,no free fluid,no pain during ultrasound,ovaries appear mobile

## 2025-01-11 ENCOUNTER — Ambulatory Visit: Payer: Self-pay | Admitting: Obstetrics & Gynecology

## 2025-06-09 ENCOUNTER — Ambulatory Visit: Admitting: Physician Assistant
